# Patient Record
Sex: Male | Born: 1970 | Race: White | Hispanic: No | Marital: Married
Health system: Midwestern US, Academic
[De-identification: ages and names within clinical notes are randomized; demographics above are authoritative.]

---

## 2016-07-28 ENCOUNTER — Encounter: Admit: 2016-07-28 | Discharge: 2016-07-29 | Primary: Family

## 2016-07-28 ENCOUNTER — Encounter: Admit: 2016-07-28 | Discharge: 2016-07-28 | Primary: Family

## 2016-07-28 DIAGNOSIS — R69 Illness, unspecified: Principal | ICD-10-CM

## 2016-07-31 ENCOUNTER — Encounter: Admit: 2016-07-31 | Discharge: 2016-07-31 | Primary: Family

## 2016-07-31 DIAGNOSIS — S32010A Wedge compression fracture of first lumbar vertebra, initial encounter for closed fracture: Principal | ICD-10-CM

## 2016-08-01 ENCOUNTER — Ambulatory Visit: Admit: 2016-08-01 | Discharge: 2016-08-01 | Payer: BC Managed Care – PPO | Primary: Family

## 2016-08-01 ENCOUNTER — Encounter: Admit: 2016-08-01 | Discharge: 2016-08-01 | Primary: Family

## 2016-08-01 ENCOUNTER — Ambulatory Visit: Admit: 2016-08-01 | Discharge: 2016-08-01 | Primary: Family

## 2016-08-01 DIAGNOSIS — S32010A Wedge compression fracture of first lumbar vertebra, initial encounter for closed fracture: Principal | ICD-10-CM

## 2016-08-01 DIAGNOSIS — R69 Illness, unspecified: Principal | ICD-10-CM

## 2016-08-01 MED ORDER — OXYCODONE-ACETAMINOPHEN 5-325 MG PO TAB
1 | ORAL_TABLET | ORAL | 0 refills | 2.00000 days | Status: AC | PRN
Start: 2016-08-01 — End: 2016-12-19

## 2016-08-23 ENCOUNTER — Encounter: Admit: 2016-08-23 | Discharge: 2016-08-23 | Primary: Family

## 2016-08-23 DIAGNOSIS — S32010D Wedge compression fracture of first lumbar vertebra, subsequent encounter for fracture with routine healing: Principal | ICD-10-CM

## 2016-08-29 ENCOUNTER — Ambulatory Visit: Admit: 2016-08-29 | Discharge: 2016-08-29 | Payer: BC Managed Care – PPO | Primary: Family

## 2016-08-29 ENCOUNTER — Ambulatory Visit: Admit: 2016-08-29 | Discharge: 2016-08-29 | Primary: Family

## 2016-08-29 ENCOUNTER — Encounter: Admit: 2016-08-29 | Discharge: 2016-08-29 | Primary: Family

## 2016-08-29 DIAGNOSIS — S32010D Wedge compression fracture of first lumbar vertebra, subsequent encounter for fracture with routine healing: Principal | ICD-10-CM

## 2016-10-23 ENCOUNTER — Encounter: Admit: 2016-10-23 | Discharge: 2016-10-23 | Primary: Family

## 2016-10-23 DIAGNOSIS — S32010D Wedge compression fracture of first lumbar vertebra, subsequent encounter for fracture with routine healing: Principal | ICD-10-CM

## 2016-10-28 ENCOUNTER — Ambulatory Visit: Admit: 2016-10-28 | Discharge: 2016-10-28 | Payer: BC Managed Care – PPO | Primary: Family

## 2016-10-28 ENCOUNTER — Encounter: Admit: 2016-10-28 | Discharge: 2016-10-28 | Primary: Family

## 2016-10-28 ENCOUNTER — Ambulatory Visit: Admit: 2016-10-28 | Discharge: 2016-10-28 | Primary: Family

## 2016-10-28 DIAGNOSIS — S32010D Wedge compression fracture of first lumbar vertebra, subsequent encounter for fracture with routine healing: Principal | ICD-10-CM

## 2016-12-12 ENCOUNTER — Encounter: Admit: 2016-12-12 | Discharge: 2016-12-12 | Primary: Family

## 2016-12-12 DIAGNOSIS — S32010D Wedge compression fracture of first lumbar vertebra, subsequent encounter for fracture with routine healing: Principal | ICD-10-CM

## 2016-12-19 ENCOUNTER — Ambulatory Visit: Admit: 2016-12-19 | Discharge: 2016-12-19 | Payer: BC Managed Care – PPO | Primary: Family

## 2016-12-19 ENCOUNTER — Ambulatory Visit: Admit: 2016-12-19 | Discharge: 2016-12-19 | Primary: Family

## 2016-12-19 ENCOUNTER — Encounter: Admit: 2016-12-19 | Discharge: 2016-12-19 | Primary: Family

## 2016-12-19 DIAGNOSIS — S32010D Wedge compression fracture of first lumbar vertebra, subsequent encounter for fracture with routine healing: Principal | ICD-10-CM

## 2016-12-19 NOTE — Progress Notes
[facility]IC NOTE  Subjective     HISTORY:  He presents today in follow up of his L1 compression fracture.  He is about five months out from his injury.  He did attend about eight weeks of therapy, three times a week and is no longer attending therapy and completed therapy last Thursday.  Overall he is doing well and felt therapy was helpful.    PHYSICAL EXAMINATION:  No specific back discomfort.  Lower extremity reflexes, motor and sensory testing are stable              X-RAYS: X-rays obtained today of the lumbar spine, standing AP lateral, shows stable alignment.  His fracture appears to be healed.      IMPRESSION:  L1 compression fracture - healed.    PLAN: His x-rays were reviewed with him and his wife.  He was encouraged to transition to a home exercise program as instructed in physical therapy.  He was provided a note for his employer that he may return back to work without restrictions.  He will follow up with any changes, problems, concerns or questions.           Review of Systems   Constitutional: Negative.    HENT: Negative.    Eyes: Negative.    Respiratory: Negative.    Cardiovascular: Negative.    Gastrointestinal: Negative.    Endocrine: Negative.    Genitourinary: Negative.    Musculoskeletal: Negative.    Skin: Negative.    Allergic/Immunologic: Negative.    Neurological: Negative.    Hematological: Negative.    Psychiatric/Behavioral: Negative.        Current Outpatient Prescriptions:   ???  acetaminophen (TYLENOL) 500 mg tablet, Take 500 mg by mouth every 6 hours as needed for Pain. Max of 4,000 mg of acetaminophen in 24 hours., Disp: , Rfl:   ???  IBUPROFEN PO, Take  by mouth daily., Disp: , Rfl:   ???  naproxen sodium (ALEVE PO), Take  by mouth daily., Disp: , Rfl:   ???  oxyCODONE/acetaminophen (PERCOCET; ENDOCET; ROXICET) 5/325 mg tablet, Take 1 tablet by mouth every 6 hours as needed for Pain, Disp: 100 tablet, Rfl: 0  No Known Allergies  Physical Exam  Vitals:    12/19/16 1327 BP: 106/74   Pulse: 79   SpO2: 98%   Weight: 79.4 kg (175 lb)   Height: 176 cm (69.29)     Oswestry Total Score:: 0     Body mass index is 25.63 kg/m???Marland Kitchen             In the presence of Dr. Jean Rosenthal, I am taking down these notes, D. Lynch, scribe.  12/19/16 @ 2:00 pm

## 2018-11-10 ENCOUNTER — Encounter: Admit: 2018-11-10 | Discharge: 2018-11-10

## 2018-11-10 ENCOUNTER — Observation Stay: Admit: 2018-11-10 | Discharge: 2018-11-10 | Payer: BC Managed Care – PPO

## 2018-11-10 ENCOUNTER — Emergency Department: Admit: 2018-11-10 | Discharge: 2018-11-10

## 2018-11-10 MED ORDER — FENTANYL CITRATE (PF) 50 MCG/ML IJ SOLN
50 ug | Freq: Once | INTRAVENOUS | 0 refills | Status: CP
Start: 2018-11-10 — End: ?

## 2018-11-10 NOTE — ED Notes
Rechecked pedal pulse present

## 2018-11-10 NOTE — ED Notes
Unable to feel pedal pulse, unable to  Hear it with a doppler,

## 2018-11-11 LAB — VITAMIN B12: Lab: 468 pg/mL — ABNORMAL HIGH (ref 180–914)

## 2018-11-11 LAB — CELL COUNT W/DIFF-SYN FLUID

## 2018-11-11 LAB — CBC AND DIFF
Lab: 0.1 10*3/uL (ref 0–0.45)
Lab: 19 (ref <20.7)
Lab: 6 10*3/uL (ref 4.5–11.0)

## 2018-11-11 LAB — COMPREHENSIVE METABOLIC PANEL
Lab: 0.8 mg/dL — ABNORMAL LOW (ref 0.3–1.2)
Lab: 139 MMOL/L — ABNORMAL LOW (ref 137–147)
Lab: 22 U/L (ref 7–56)
Lab: 24 U/L (ref 7–40)
Lab: 25 MMOL/L (ref 21–30)
Lab: 4.4 g/dL (ref 3.5–5.0)
Lab: 87 U/L (ref 25–110)
Lab: 9.8 mg/dL (ref 8.5–10.6)
Lab: >60 mL/min (ref >60)
Lab: 137 MMOL/L — ABNORMAL LOW (ref >60)
Lab: 19 U/L (ref 7–56)
Lab: 21 U/L (ref 7–40)
Lab: 24 MMOL/L (ref 21–30)
Lab: 8 (ref 3–12)
Lab: 80 U/L (ref 25–110)
Lab: >60 mL/min (ref >60)
Lab: >60 mL/min (ref >60)

## 2018-11-11 LAB — URINALYSIS, MICROSCOPIC

## 2018-11-11 LAB — 25-OH VITAMIN D (D2 + D3): Lab: 32 ng/mL — ABNORMAL HIGH (ref 30–80)

## 2018-11-11 LAB — URINALYSIS DIPSTICK
Lab: NEGATIVE
Lab: NEGATIVE
Lab: NEGATIVE
Lab: NEGATIVE
Lab: NEGATIVE
Lab: NEGATIVE
Lab: NEGATIVE

## 2018-11-11 LAB — GRAM STAIN

## 2018-11-11 LAB — HIV 1& 2 AG-AB SCRN W REFLEX HIV 1 PCR QUANT

## 2018-11-11 LAB — SYPHILIS AB SCREEN: Lab: NEGATIVE mg/dL — ABNORMAL HIGH (ref 8.5–10.6)

## 2018-11-11 LAB — URIC ACID: Lab: 4.4 mg/dL (ref 4.0–8.0)

## 2018-11-11 LAB — CCP IGG ANTIBODY

## 2018-11-11 LAB — C REACTIVE PROTEIN (CRP): Lab: 1.7 mg/dL — ABNORMAL HIGH (ref <1.0)

## 2018-11-11 LAB — MAGNESIUM: Lab: 2 mg/dL (ref 1.6–2.6)

## 2018-11-11 LAB — COVID-19 (SARS-COV-2) PCR

## 2018-11-11 LAB — CRYSTAL ANALYSIS-SYNOVIAL FLUID

## 2018-11-11 LAB — CBC: Lab: 6.4 10*3/uL (ref >60)

## 2018-11-11 LAB — ANTI-DNA DOUBLE STRAND: Lab: <10 {titer} (ref <10)

## 2018-11-11 LAB — PROTIME INR (PT): Lab: 1 mg/dL — ABNORMAL HIGH (ref 0.8–1.2)

## 2018-11-11 LAB — PTT (APTT): Lab: 30 s — ABNORMAL HIGH (ref 24.0–36.5)

## 2018-11-11 LAB — RHEUMATOID FACTOR (RF): Lab: <10 [IU]/mL — ABNORMAL LOW (ref <25)

## 2018-11-11 LAB — SED RATE: Lab: 26 mm/h — ABNORMAL HIGH (ref 0–15)

## 2018-11-11 LAB — ANTI-NUCLEAR ANTIBODY(ANA): Lab: <80 {titer} — ABNORMAL LOW (ref <80)

## 2018-11-11 LAB — FOLATE, SERUM: Lab: 13 ng/mL (ref >3.9)

## 2018-11-11 MED ORDER — NICOTINE 21 MG/24 HR TD PT24
1 | Freq: Every day | TRANSDERMAL | 0 refills | Status: DC
Start: 2018-11-11 — End: 2018-11-13
  Administered 2018-11-11 – 2018-11-13 (×3): 1 via TRANSDERMAL

## 2018-11-11 MED ORDER — HYDROCODONE-ACETAMINOPHEN 5-325 MG PO TAB
1 | ORAL | 0 refills | Status: DC | PRN
Start: 2018-11-11 — End: 2018-11-11
  Administered 2018-11-11: 07:00:00 1 via ORAL

## 2018-11-11 MED ORDER — ENOXAPARIN 40 MG/0.4 ML SC SYRG
40 mg | Freq: Every day | SUBCUTANEOUS | 0 refills | Status: DC
Start: 2018-11-11 — End: 2018-11-13
  Administered 2018-11-12 – 2018-11-13 (×2): 40 mg via SUBCUTANEOUS

## 2018-11-11 MED ORDER — IMS MIXTURE TEMPLATE
800 mg | Freq: Three times a day (TID) | ORAL | 0 refills | Status: DC
Start: 2018-11-11 — End: 2018-11-11
  Administered 2018-11-11 (×2): 800 mg via ORAL

## 2018-11-11 MED ORDER — KETOROLAC 15 MG/ML IJ SOLN
15 mg | INTRAVENOUS | 0 refills | Status: DC | PRN
Start: 2018-11-11 — End: 2018-11-13
  Administered 2018-11-12 – 2018-11-13 (×3): 15 mg via INTRAVENOUS

## 2018-11-11 MED ORDER — OXYCODONE-ACETAMINOPHEN 5-325 MG PO TAB
1 | ORAL | 0 refills | Status: DC | PRN
Start: 2018-11-11 — End: 2018-11-13
  Administered 2018-11-11: 11:00:00 1 via ORAL

## 2018-11-11 MED ADMIN — FENTANYL CITRATE (PF) 50 MCG/ML IJ SOLN [3037]: 50 ug | INTRAVENOUS | @ 04:00:00 | Stop: 2018-11-11 | NDC 00409909412

## 2018-11-11 NOTE — Consults
[facility] Note    During normal business hours (Monday - Friday 7AM - 5 PM), please contact Rudell Cobb, Sabino Donovan, Velda Shell. At all other times, contact the orthopedic surgery resident on call for any questions or concerns.      Admission Date: 11/10/2018                                                  Chief Complaint/Reason for Consult: Right ankle pain    Assessment/Plan   Philip Bennett is a 48 y.o. male without significant past medical history presents with elevated inflammatory markers, right ankle pain and swelling, concern for septic arthritis    Operative Intervention: No acute orthopedic surgical intervention at this time, pending results of right ankle aspiration  WB status: WBAT RLE   Antibiotics/tetanus: Per primary team, ankle aspirate has been obtained  Pain Control: Per primary team  Diet: Please keep n.p.o. after midnight  DVT Ppx: Per primary team  ?Ultrasound for DVT was negative.  I will obtain x-rays of the right tibia/fibula for completeness sake due to anterior fullness and edema    Intervention/Procedure: After risk, benefits, alternatives was discussed with the patient and his wife, they were agreeable to proceed with right ankle aspiration in order to obtain results of the right ankle joint.  An 18-gauge needle was inserted into the right ankle joint just medial to the anterior tibialis tendon.  Sterile prep was used with Betadine, alcohol.  Approximately 5 mL of cloudy synovial fluid mixed with sanguinous fluid was obtained.  This was sent for cell count, culture, Gram stain, crystals.      Disposition: This patient is being admitted to the internal medicine service due to the inability to bear weight and pain as well as concern for septic arthritis.    This pt will be discussed with Dr. Desiree Hane, who will direct the plan of care.     Orvan Falconer [id number]      --------------------------------------------------------------------------------------------------- History of Present Illness: Philip Bennett is a 48 y.o. male.  This patient has a past medical history of tobacco use, half pack per day, 2-3 beers per day, no significant medical history and presents with a 2 to 3-day history of worsening right ankle pain and swelling.  He does endorse being awoken in the night 3 nights ago with severe ankle pain.  He has subsequently had worsening of this and inability to bear weight on the right ankle.  He also endorses a right lower extremity calf and shin pain as well.  Though, most notably the pain in the right ankle.  He states that he also had a intermittent numbness and tingling in bilateral upper extremities that resolved 3 nights ago.  Denies any constitutional symptoms such as fevers, chills, chest pain, shortness of breath, diarrhea, dysuria, abdominal pain.  No coronavirus contacts.      10 point review of systems reviewed with the patient.   Pertinent positives: bone pain, right ankle pain, right lower extremity anterior calf and shin pain.  Pertinent negatives: Patient denies chest pain, shortness of breath, abdominal pain, dysuria, diarrhea, fever, chills, other constitutional symptoms at this time.      Physical Exam:    Constitutional: No acute distress  Neurologic: Alert  Eyes: EOM grossly intact  HEENT: Oropharynx/Nasopharynx clear  Respiratory: Unlabored respirations  Cardiovascular:  Rate normal  Gastrointestinal: No obvious distention  MSK:     Right Lower Extremity Exam:  SILT in the tibial/peroneal nerve distribution  Plantarflexion/dorsiflexion of the ankle and toes intact  Pulse: Palpable DP and PT pulse  Other exam findings: Right ankle effusion present.  There is no erythema or induration.  He does have a fullness in the right anterior shin.    Bilateral upper extremities were examined as well he has sensation intact light touch in the median/ulnar/radial nerve distribution.  Palpable radial and ulnar pulse bilaterally. Pertinent Lab/Radiology/Other Diagnostic Tests:    Radiology: X-ray right ankle reveals no acute osseous abnormalities.  There is no acute fracture dislocation noted.  Ultrasound noted a subcutaneous anemia in the anteromedial t lower leg.    Laboratory: Elevated ESR and CRP.  ESR is 26 CRP is 1.7.      No past medical history on file.  Surgical History:   Procedure Laterality Date   ? FOOT SURGERY Right    ? HAND SURGERY Bilateral        Social History  Social History     Socioeconomic History   ? Marital status: Married     Spouse name: Not on file   ? Number of children: Not on file   ? Years of education: Not on file   ? Highest education level: Not on file   Occupational History   ? Not on file   Tobacco Use   ? Smoking status: Current Every Day Smoker     Packs/day: 1.50   ? Smokeless tobacco: Never Used   Substance and Sexual Activity   ? Alcohol use: Yes     Comment: occasionally    ? Drug use: No   ? Sexual activity: Not on file   Other Topics Concern   ? Not on file   Social History Narrative   ? Not on file           Family history  Family History   Problem Relation Age of Onset   ? Hypertension Mother    ? Cancer Father          Allergies:  Patient has no known allergies.    Outpatient Medications as of 11/10/2018   Medication Sig Dispense Refill   ? acetaminophen (TYLENOL) 500 mg tablet Take 500 mg by mouth every 6 hours as needed for Pain. Max of 4,000 mg of acetaminophen in 24 hours.     ? IBUPROFEN PO Take  by mouth daily.     ? naproxen sodium (ALEVE PO) Take  by mouth daily.           Review of Systems:  A 12 point review of systems is negative other than those noted in the HPI          Vital Signs:  Last Filed in 24 hours   BP: 134/101 (10/06 1726)  Temp: 37.1 ?C (98.8 ?F) (10/06 1726)  Respirations: 20 PER MINUTE (10/06 1726)  SpO2: 100 % (10/06 1726)  SpO2 Pulse: 88 (10/06 1726)       CBC w/Diff   Lab Results   Component Value Date/Time    WBC 6.0 11/10/2018 07:38 PM HGB 14.0 11/10/2018 07:38 PM    HCT 40.7 11/10/2018 07:38 PM    PLTCT 304 11/10/2018 07:38 PM           Basic Metabolic Profile   Lab Results   Component Value Date/Time    NA 139 11/10/2018 07:38 PM  K 4.3 11/10/2018 07:38 PM    CL 104 11/10/2018 07:38 PM    CO2 25 11/10/2018 07:38 PM    GAP 10 11/10/2018 07:38 PM    BUN 12 11/10/2018 07:38 PM    CR 1.07 11/10/2018 07:38 PM    GLU 94 11/10/2018 07:38 PM        Coagulation Studies   Lab Results   Component Value Date/Time    PTT 30.7 11/10/2018 07:38 PM    INR 1.0 11/10/2018 07:38 PM        Vital Signs:  Last Filed in 24 hours   BP: 134/101 (10/06 1726)  Temp: 37.1 ?C (98.8 ?F) (10/06 1726)  Respirations: 20 PER MINUTE (10/06 1726)  SpO2: 100 % (10/06 1726)  SpO2 Pulse: 88 (10/06 1726)       CBC w/Diff   Lab Results   Component Value Date/Time    WBC 6.0 11/10/2018 07:38 PM    HGB 14.0 11/10/2018 07:38 PM    HCT 40.7 11/10/2018 07:38 PM    PLTCT 304 11/10/2018 07:38 PM           Basic Metabolic Profile   Lab Results   Component Value Date/Time    NA 139 11/10/2018 07:38 PM    K 4.3 11/10/2018 07:38 PM    CL 104 11/10/2018 07:38 PM    CO2 25 11/10/2018 07:38 PM    GAP 10 11/10/2018 07:38 PM    BUN 12 11/10/2018 07:38 PM    CR 1.07 11/10/2018 07:38 PM    GLU 94 11/10/2018 07:38 PM        Coagulation Studies   Lab Results   Component Value Date/Time    PTT 30.7 11/10/2018 07:38 PM    INR 1.0 11/10/2018 07:38 PM           R. Orvan Falconer, MD  Pager [phone]

## 2018-11-12 LAB — BASIC METABOLIC PANEL
Lab: 1.1 mg/dL (ref 0.4–1.24)
Lab: 105 MMOL/L — ABNORMAL LOW (ref 98–110)
Lab: 122 mg/dL — ABNORMAL HIGH (ref 70–100)
Lab: 138 MMOL/L — ABNORMAL LOW (ref 137–147)
Lab: 17 mg/dL (ref 7–25)
Lab: 24 MMOL/L — ABNORMAL HIGH (ref 21–30)
Lab: 4.6 MMOL/L (ref 3.5–5.1)
Lab: 9 pg — ABNORMAL HIGH (ref 3–12)
Lab: 9.4 mg/dL — ABNORMAL LOW (ref 8.5–10.6)
Lab: >60 mL/min (ref >60)
Lab: >60 mL/min (ref >60)

## 2018-11-12 LAB — GRAM STAIN

## 2018-11-12 LAB — HLA-B27: Lab: POSITIVE mg/dL — ABNORMAL LOW (ref 0.3–1.2)

## 2018-11-12 LAB — CBC: Lab: 6.3 10*3/uL (ref 4.5–11.0)

## 2018-11-12 MED ORDER — IMS MIXTURE TEMPLATE
15 mg | Freq: Every day | ORAL | 0 refills | Status: DC
Start: 2018-11-12 — End: 2018-11-13
  Administered 2018-11-12 – 2018-11-13 (×4): 15 mg via ORAL

## 2018-11-12 MED ORDER — CHOLECALCIFEROL (VITAMIN D3) 10 MCG (400 UNIT) PO TAB
400 [IU] | Freq: Every day | ORAL | 0 refills | Status: DC
Start: 2018-11-12 — End: 2018-11-13
  Administered 2018-11-12 – 2018-11-13 (×2): 400 [IU] via ORAL

## 2018-11-13 ENCOUNTER — Encounter: Admit: 2018-11-13 | Discharge: 2018-11-13 | Payer: BC Managed Care – PPO

## 2018-11-13 LAB — CRYSTAL ANALYSIS-SYNOVIAL FLUID

## 2018-11-13 LAB — CBC
Lab: 103 FL — ABNORMAL HIGH (ref 80–100)
Lab: 12 % (ref 11–15)
Lab: 14 g/dL (ref 13.5–16.5)
Lab: 292 10*3/uL (ref 150–400)
Lab: 3.8 M/UL — ABNORMAL LOW (ref 4.4–5.5)
Lab: 34 g/dL (ref 32.0–36.0)
Lab: 36 pg — ABNORMAL HIGH (ref 26–34)
Lab: 40 % (ref 40–50)
Lab: 6.8 FL — ABNORMAL LOW (ref 7–11)
Lab: 7.5 K/UL (ref >60)

## 2018-11-13 LAB — BASIC METABOLIC PANEL: Lab: 141 MMOL/L — ABNORMAL LOW (ref >60)

## 2018-11-13 LAB — CELL COUNT W/DIFF-SYN FLUID

## 2018-11-13 MED ORDER — SULFASALAZINE 500 MG PO TBEC
500 mg | ORAL_TABLET | Freq: Two times a day (BID) | ORAL | 1 refills | Status: AC
Start: 2018-11-13 — End: ?
  Filled 2018-11-13: qty 180, 30d supply, fill #1

## 2018-11-13 MED ORDER — NICOTINE 21 MG/24 HR TD PT24
1 | MEDICATED_PATCH | Freq: Every day | TRANSDERMAL | 0 refills | Status: DC
Start: 2018-11-13 — End: 2019-07-29
  Filled 2018-11-13: qty 28, 28d supply, fill #1

## 2018-11-13 MED ORDER — NAPROXEN 500 MG PO TAB
500 mg | ORAL_TABLET | Freq: Two times a day (BID) | ORAL | 0 refills | Status: DC
Start: 2018-11-13 — End: 2018-12-23
  Filled 2018-11-13: qty 28, 14d supply, fill #1

## 2018-11-13 MED ORDER — IBUPROFEN 200 MG PO TAB
600-800 mg | ORAL_TABLET | ORAL | 0 refills | Status: CN | PRN
Start: 2018-11-13 — End: ?

## 2018-11-13 MED ORDER — PREDNISONE 5 MG PO TAB
ORAL_TABLET | Freq: Every day | ORAL | 0 refills | Status: AC
Start: 2018-11-13 — End: ?
  Filled 2018-11-13: qty 84, 28d supply, fill #1

## 2018-11-13 MED ORDER — SULFASALAZINE 500 MG PO TBEC
500 mg | Freq: Two times a day (BID) | ORAL | 0 refills | Status: DC
Start: 2018-11-13 — End: 2018-11-13

## 2018-11-13 MED ORDER — CHOLECALCIFEROL (VITAMIN D3) 10 MCG (400 UNIT) PO TAB
400 [IU] | Freq: Every day | ORAL | 0 refills | 84.00000 days | Status: AC
Start: 2018-11-13 — End: ?

## 2018-11-14 LAB — CHLAM/NG PCR SWAB
Lab: NEGATIVE
Lab: NEGATIVE

## 2018-11-15 LAB — CULTURE-WOUND/TISSUE/FLUID(AEROBIC ONLY)W/SENSITIVITY: Lab: <2 %

## 2018-11-16 LAB — HLA-B27: Lab: POSITIVE

## 2018-11-17 LAB — CULTURE-BLOOD W/SENSITIVITY

## 2018-11-18 LAB — CULTURE-WOUND/TISSUE/FLUID(AEROBIC ONLY)W/SENSITIVITY

## 2018-11-25 ENCOUNTER — Encounter: Admit: 2018-11-25 | Discharge: 2018-11-25 | Payer: BC Managed Care – PPO

## 2018-11-25 NOTE — Telephone Encounter
Received paperwork from [place]. Paperwork completed by Dr. Waynard Edwards. Faxing paperwork to [place] at [phone]. Placing a copy in Onbase.

## 2018-12-07 ENCOUNTER — Encounter: Admit: 2018-12-07 | Discharge: 2018-12-07 | Payer: BC Managed Care – PPO

## 2018-12-12 ENCOUNTER — Encounter: Admit: 2018-12-12 | Discharge: 2018-12-12 | Payer: BC Managed Care – PPO

## 2018-12-23 ENCOUNTER — Encounter: Admit: 2018-12-23 | Discharge: 2018-12-23 | Payer: BC Managed Care – PPO

## 2018-12-23 ENCOUNTER — Ambulatory Visit: Admit: 2018-12-23 | Discharge: 2018-12-23 | Payer: BC Managed Care – PPO

## 2018-12-23 DIAGNOSIS — M25561 Pain in right knee: Secondary | ICD-10-CM

## 2018-12-23 DIAGNOSIS — M0230 Reiter's disease, unspecified site: Secondary | ICD-10-CM

## 2018-12-23 DIAGNOSIS — D849 Immunodeficiency, unspecified: Secondary | ICD-10-CM

## 2018-12-23 MED ORDER — PREDNISONE 5 MG PO TAB
2.5 mg | ORAL_TABLET | Freq: Every day | ORAL | 0 refills | Status: DC
Start: 2018-12-23 — End: 2019-05-24

## 2018-12-23 MED ORDER — NAPROXEN 500 MG PO TAB
500 mg | ORAL_TABLET | Freq: Two times a day (BID) | ORAL | 3 refills | Status: DC
Start: 2018-12-23 — End: 2019-07-29

## 2018-12-24 LAB — HEPATITIS C ANTIBODY W REFLEX HCV PCR QUANT

## 2018-12-24 LAB — HEPATITIS B SURFACE AB: Lab: NEGATIVE % (ref 4–12)

## 2018-12-24 LAB — CBC AND DIFF
Lab: 0 10*3/uL (ref 0–0.20)
Lab: 0.4 10*3/uL (ref 0–0.80)
Lab: 1 % (ref 0–5)
Lab: 15 % — ABNORMAL HIGH (ref 11–15)
Lab: 18 % — ABNORMAL LOW (ref 24–44)
Lab: 3.8 M/UL — ABNORMAL LOW (ref 4.4–5.5)
Lab: 36 pg — ABNORMAL HIGH (ref 26–34)
Lab: 4.7 10*3/uL (ref 1.8–7.0)
Lab: 6.5 10*3/uL (ref 4.5–11.0)
Lab: 7.1 FL (ref 7–11)

## 2018-12-24 LAB — CREATININE
Lab: 1.2 mg/dL (ref 0.4–1.24)
Lab: >60 mL/min (ref >60)

## 2018-12-24 LAB — HEPATITIS B SURFACE AG

## 2018-12-24 LAB — HEPATITIS A TOTAL AB (IGG+IGM)

## 2018-12-24 LAB — AST (SGOT): Lab: 26 U/L — ABNORMAL HIGH (ref >60)

## 2018-12-24 LAB — SED RATE: Lab: 1 mm/h (ref 0–15)

## 2018-12-24 LAB — C REACTIVE PROTEIN (CRP): Lab: 0.1 mg/dL (ref <1.0)

## 2018-12-24 LAB — HEPATITIS B CORE AB TOT (IGG+IGM)

## 2018-12-29 ENCOUNTER — Encounter: Admit: 2018-12-29 | Discharge: 2018-12-29 | Payer: BC Managed Care – PPO

## 2018-12-29 NOTE — Telephone Encounter
-----   Message from Craige Cotta, MD sent at 12/23/2018  4:55 PM CST -----  Can you send the last clinic note and autoimmune labs and imaging from his hospitalization in October 2020 to his PCP, Ms. Aundra Dubin in [place]. Phone number is [phone]. Can you also fax labs (CBC and diff, Cr, AST) to [facility] in [facility]. Their phone number is [phone]. Thanks!Craige Cotta, MDRheumatology Fellow, PGY-4Pager Number: [phone]

## 2018-12-30 NOTE — Telephone Encounter
Faxing labs orders to [facility] lab at [phone].     Unable to get fax number to fax medical records to Dr. Aundra Dubin, Will call tomorrow.

## 2019-05-10 ENCOUNTER — Encounter: Admit: 2019-05-10 | Discharge: 2019-05-10 | Payer: BC Managed Care – PPO

## 2019-05-10 NOTE — Telephone Encounter
Records received from Dr Sandria Senter office. Documents [place] on base for scanning.    Staff message sent to Dr Renee Rival for review

## 2019-05-19 ENCOUNTER — Encounter: Admit: 2019-05-19 | Discharge: 2019-05-19 | Payer: BC Managed Care – PPO

## 2019-05-19 ENCOUNTER — Ambulatory Visit: Admit: 2019-05-19 | Discharge: 2019-05-19 | Payer: BC Managed Care – PPO

## 2019-05-19 DIAGNOSIS — R21 Rash and other nonspecific skin eruption: Secondary | ICD-10-CM

## 2019-05-19 DIAGNOSIS — R112 Nausea with vomiting, unspecified: Secondary | ICD-10-CM

## 2019-05-19 DIAGNOSIS — D849 Immunodeficiency, unspecified: Secondary | ICD-10-CM

## 2019-05-19 DIAGNOSIS — M0230 Reiter's disease, unspecified site: Secondary | ICD-10-CM

## 2019-05-19 LAB — CBC AND DIFF
Lab: 0 10*3/uL (ref 0–0.45)
Lab: 0.4 10*3/uL (ref 0–0.80)
Lab: 0.9 10*3/uL — ABNORMAL LOW (ref 1.0–4.8)
Lab: 1 % (ref 0–2)
Lab: 1 % (ref 0–5)
Lab: 10 % (ref 4–12)
Lab: 110 FL — ABNORMAL HIGH (ref 80–100)
Lab: 13 g/dL — ABNORMAL LOW (ref 13.5–16.5)
Lab: 15 % — ABNORMAL HIGH (ref 11–15)
Lab: 2.8 10*3/uL (ref 1.8–7.0)
Lab: 21 % — ABNORMAL LOW (ref <20.7)
Lab: 246 K/UL — ABNORMAL HIGH (ref 150–400)
Lab: 3.6 M/UL — ABNORMAL LOW (ref 4.4–5.5)
Lab: 33 g/dL — ABNORMAL HIGH (ref >60)
Lab: 37 pg — ABNORMAL HIGH (ref 26–34)
Lab: 4.3 K/UL — ABNORMAL LOW (ref 4.5–11.0)
Lab: 40 % — AB (ref 40–50)
Lab: 6.8 FL — ABNORMAL LOW (ref 7–11)
Lab: 67 % (ref 41–77)

## 2019-05-19 LAB — HIV 1& 2 AG-AB SCRN W REFLEX HIV 1 PCR QUANT

## 2019-05-19 LAB — FOLATE, SERUM: Lab: 11 ng/mL (ref >3.9)

## 2019-05-19 LAB — COMPREHENSIVE METABOLIC PANEL
Lab: 0.7 mg/dL (ref 0.3–1.2)
Lab: 1.2 mg/dL (ref 0.4–1.24)
Lab: 102 mg/dL — ABNORMAL HIGH (ref 70–100)
Lab: 104 MMOL/L (ref 98–110)
Lab: 142 MMOL/L (ref 137–147)
Lab: 4.4 g/dL (ref 3.5–5.0)
Lab: 4.5 MMOL/L (ref 3.5–5.1)
Lab: 7 g/dL (ref 6.0–8.0)
Lab: 9 mg/dL (ref 7–25)
Lab: 9.8 mg/dL (ref 8.5–10.6)

## 2019-05-19 LAB — SED RATE: Lab: <1 mm/h — ABNORMAL LOW (ref >60)

## 2019-05-19 LAB — C REACTIVE PROTEIN (CRP): Lab: 0.1 mg/dL (ref <1.0)

## 2019-05-19 LAB — VITAMIN B12: Lab: 531 pg/mL (ref 180–914)

## 2019-05-19 MED ORDER — GABAPENTIN 100 MG PO CAP
100 mg | ORAL_CAPSULE | Freq: Every evening | ORAL | 3 refills | Status: AC
Start: 2019-05-19 — End: ?

## 2019-05-19 MED ORDER — NYSTATIN 100,000 UNIT/ML PO SUSP
500000 [IU] | Freq: Four times a day (QID) | ORAL | 0 refills | 12.00000 days | Status: AC
Start: 2019-05-19 — End: ?

## 2019-05-24 MED ORDER — ADALIMUMAB 40 MG/0.4 ML SC PNKT
40 mg | SUBCUTANEOUS | 4 refills | Status: DC
Start: 2019-05-24 — End: 2019-05-28

## 2019-05-24 MED ORDER — PREDNISONE 5 MG PO TAB
ORAL_TABLET | Freq: Every day | ORAL | 0 refills | Status: AC
Start: 2019-05-24 — End: ?

## 2019-05-24 MED ORDER — ADALIMUMAB 40 MG/0.8 ML SC PNKT
40 mg | SUBCUTANEOUS | 3 refills | Status: DC
Start: 2019-05-24 — End: 2019-05-24

## 2019-05-24 NOTE — Telephone Encounter
Patient called today stating he was returning a call from Dr. Renee Rival from this morning.   He stated he believes he was calling to review recent blood work results.   Patient can be reached at [phone].    Routing to provider.

## 2019-05-24 NOTE — Progress Notes
The Prior Authorization for Humira was submitted for Philip Bennett via Cover My Meds.  Will continue to follow.    Teagon Kron  Pharmacy Patient Advocate  [phone]

## 2019-05-25 MED ORDER — MUPIROCIN 2 % TP OINT
Freq: Every day | TOPICAL | 3 refills | 11.00000 days | Status: AC
Start: 2019-05-25 — End: ?

## 2019-05-25 MED ORDER — CLINDAMYCIN PHOSPHATE 1 % TP SOLN
Freq: Every day | 11 refills | 7.00000 days | Status: AC
Start: 2019-05-25 — End: ?

## 2019-05-25 MED ORDER — CHLORHEXIDINE GLUCONATE 4 % TP LIQD
Freq: Every day | 11 refills | 16.00000 days | Status: AC
Start: 2019-05-25 — End: ?

## 2019-05-25 NOTE — Progress Notes
ATTESTATION    I personally performed the key portions of the E/M visit, discussed case with resident and concur with resident documentation of history, physical exam, assessment, and treatment plan unless otherwise noted.    Staff name:  Dorothyann Peng, MD Date:  05/25/2019

## 2019-05-25 NOTE — Progress Notes
The Prior Authorization for Humira was approved for Philip Bennett from 05/24/19 to 05/23/20. The PA authorization number is [id number].    Reford E Casebier's prescription for Humira is not able to be filled by [facility] due to the patient's prescription insurance.  Pharmacy has contacted Betsy Coder to notify them that their prescription will be sent to [facility] as required by their prescription insurance.  The specialty pharmacy team has also notified the ambulatory clinical pharmacist by email.    Fredda Hammed  Pharmacy Patient Advocate  [phone]

## 2019-05-28 MED ORDER — ADALIMUMAB 40 MG/0.4 ML SC PNKT
40 mg | SUBCUTANEOUS | 4 refills | Status: DC
Start: 2019-05-28 — End: 2019-06-11

## 2019-05-31 MED ORDER — NAPROXEN 250 MG PO TAB
250 mg | ORAL_TABLET | Freq: Two times a day (BID) | ORAL | 1 refills | Status: DC
Start: 2019-05-31 — End: 2019-08-16

## 2019-05-31 MED ORDER — ONDANSETRON 4 MG PO TBDI
4 mg | ORAL_TABLET | ORAL | 1 refills | 8.00000 days | Status: DC | PRN
Start: 2019-05-31 — End: 2019-08-16

## 2019-06-11 ENCOUNTER — Encounter: Admit: 2019-06-11 | Discharge: 2019-06-11 | Payer: BC Managed Care – PPO

## 2019-06-11 MED ORDER — ADALIMUMAB 40 MG/0.4 ML SC PNKT
40 mg | SUBCUTANEOUS | 4 refills | Status: DC
Start: 2019-06-11 — End: 2019-08-03

## 2019-06-15 ENCOUNTER — Encounter: Admit: 2019-06-15 | Discharge: 2019-06-15 | Payer: BC Managed Care – PPO

## 2019-06-15 NOTE — Progress Notes
Called Philip Bennett to discuss medication assistance regarding their medication: Humira.  Left voicemail asking patient to return call to the specialty pharmacy.  Will call patient again in 2 business days if no call back received.Fredda Hammed, CPhT  Pharmacy Patient Advocate, Specialty Pharmacy  [phone]

## 2019-06-16 ENCOUNTER — Encounter: Admit: 2019-06-16 | Discharge: 2019-06-16 | Payer: BC Managed Care – PPO

## 2019-06-16 NOTE — Telephone Encounter
[facility] pharmacy called this morning requesting clarification on patients Humira prescription.   Nurse returned call to discuss.   Informed pharmacy that the script should be Humira 40 mg/0.4 mL injection- Inject 0.4 mL under the skin every 14 days.  Cofirmed diagnosis code- M02.30.   Discussed that patient is also currently taking Sulfasalazine, Prednisone, and Naproxen.   Pharmacist verbalized understanding and will call with any other questions or concerns.

## 2019-06-17 ENCOUNTER — Encounter: Admit: 2019-06-17 | Discharge: 2019-06-17 | Payer: BC Managed Care – PPO

## 2019-06-17 NOTE — Progress Notes
Kong E Prichard's prescription for Humira is not able to be filled by [facility] due to the patient's prescription insurance.  Pharmacy has contacted Betsy Coder to notify them that their prescription will be sent to [facility] as required by their prescription insurance.      Fredda Hammed  Pharmacy Patient Advocate  [phone]

## 2019-07-19 ENCOUNTER — Encounter: Admit: 2019-07-19 | Discharge: 2019-07-19 | Payer: BC Managed Care – PPO

## 2019-07-19 NOTE — Telephone Encounter
Pt called and left vm stating he needs someone to call him back because would like to speak to someone about an incident that happened last night. Pt also stating he needs paperwork filled out so that he does not have to go work.      This RN returned pt's call. Pt states he started with a swollen eye about a week ago that had green drainage and then went away. Shortly after he started having 3-4 days of pain in jaw that radiated to his ear. Pt states he went on a trip to [place] and as he was driving back, yesterday, he started having blurry vision, swelling in knees and feet, chest pain that was radiating from chest to his left arm, and numbness in left arm. Pt states he stopped at [facility] and is requesting that rheumatology clinic obtain labs that were drawn yesterday. Pt reports he is feeling well today and that he only has fatigue and weakness. Pt also requesting Dr. Renee Rival fill out a new FMLA/Leave of absence form, because pt states he can not go to work. Pt states after his wife sends his FMLA/Leave paperwork, it will need to be filled out and turned in that same day.     This RN reviewed pt's information with Dr. Renee Rival. Dr. Renee Rival asking if pt has redness, swelling or warmth to joints. Dr. Renee Rival also wanting to know if pt is currently taking humira as prescribed. Regarding FMLA/Leave of absence form, Dr. Renee Rival would like for pt to review this with PCP since he is clear from a rheumatologic stand point.     This RN called pt and asked pt if he had swelling in any of his joints, pt stated his ankles and feet. Pt denied having redness or warmth to joints. Pt states is currently taking humira as prescribed and had his first dose on 5/30 and will have his second dose today, 6/14. This RN also informed pt that Dr. Renee Rival would like for him to review FMLA/Leave of absence form with PCP. Pt states he will not see his new PCP until 6/24 and does not want to start paperwork all over with another provider. This RN informed pt he is clear to work from a rheumatologic stand point. Pt states he disagrees and does not feel like he can return to work. This RN also notified pt Dr. Renee Rival would prescribe prednisone if he felt joint swelling could improve. Pt upset about Dr. Renee Rival not filling out FMLA/Leave of absence form. This RN stated that Dr. Renee Rival would be notified.    Dr. Renee Rival in office and notified of above conversation with pt.
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
## 2019-07-29 ENCOUNTER — Ambulatory Visit
Admit: 2019-07-29 | Discharge: 2019-07-29 | Payer: BC Managed Care – PPO | Primary: Student in an Organized Health Care Education/Training Program

## 2019-07-29 ENCOUNTER — Encounter
Admit: 2019-07-29 | Discharge: 2019-07-29 | Payer: BC Managed Care – PPO | Primary: Student in an Organized Health Care Education/Training Program

## 2019-07-29 ENCOUNTER — Ambulatory Visit
Admit: 2019-07-29 | Discharge: 2019-07-29 | Payer: No Typology Code available for payment source | Primary: Student in an Organized Health Care Education/Training Program

## 2019-07-29 DIAGNOSIS — A4902 Methicillin resistant Staphylococcus aureus infection, unspecified site: Secondary | ICD-10-CM

## 2019-07-29 DIAGNOSIS — M1990 Unspecified osteoarthritis, unspecified site: Secondary | ICD-10-CM

## 2019-07-29 DIAGNOSIS — M0230 Reiter's disease, unspecified site: Secondary | ICD-10-CM

## 2019-07-29 DIAGNOSIS — Z0000 Encounter for general adult medical examination without abnormal findings: Secondary | ICD-10-CM

## 2019-07-29 DIAGNOSIS — M797 Fibromyalgia: Secondary | ICD-10-CM

## 2019-07-29 DIAGNOSIS — S069X9A Unspecified intracranial injury with loss of consciousness of unspecified duration, initial encounter: Secondary | ICD-10-CM

## 2019-07-29 DIAGNOSIS — F331 Major depressive disorder, recurrent, moderate: Secondary | ICD-10-CM

## 2019-07-29 LAB — LIPID PROFILE
Lab: 110 mg/dL — ABNORMAL HIGH (ref <100)
Lab: 127 mg/dL (ref 3.5–5.0)
Lab: 16 mg/dL (ref 0.3–1.2)
Lab: 195 mg/dL (ref <200)
Lab: 81 mg/dL (ref <150)

## 2019-07-29 LAB — COMPREHENSIVE METABOLIC PANEL
Lab: 140 MMOL/L (ref 137–147)
Lab: 17 U/L (ref 7–56)
Lab: 23 U/L (ref 7–40)
Lab: 30 MMOL/L (ref 21–30)
Lab: 4.6 MMOL/L (ref 3.5–5.1)
Lab: 74 U/L (ref 25–110)
Lab: 99 mg/dL (ref 70–100)
Lab: >60 mL/min (ref >60)
Lab: 6 (ref 3–12)

## 2019-07-29 LAB — TSH WITH FREE T4 REFLEX: Lab: 2.3 uU/mL (ref >40)

## 2019-07-29 LAB — HEMOGLOBIN A1C: Lab: 4.9 % (ref 4.0–6.0)

## 2019-07-29 MED ORDER — LIDOCAINE 5 % TP PTMD
1 | MEDICATED_PATCH | TOPICAL | 3 refills | 30.00000 days | Status: AC
Start: 2019-07-29 — End: ?

## 2019-07-29 MED ORDER — CITALOPRAM 40 MG PO TAB
40 mg | ORAL_TABLET | Freq: Every day | ORAL | 0 refills | Status: AC
Start: 2019-07-29 — End: ?

## 2019-07-31 ENCOUNTER — Encounter
Admit: 2019-07-31 | Discharge: 2019-07-31 | Payer: BC Managed Care – PPO | Primary: Student in an Organized Health Care Education/Training Program

## 2019-08-02 ENCOUNTER — Encounter
Admit: 2019-08-02 | Discharge: 2019-08-02 | Payer: BC Managed Care – PPO | Primary: Student in an Organized Health Care Education/Training Program

## 2019-08-02 MED ORDER — LIDOCAINE 5 % TP PTMD
1 | MEDICATED_PATCH | TOPICAL | 3 refills
Start: 2019-08-02 — End: ?

## 2019-08-03 ENCOUNTER — Encounter
Admit: 2019-08-03 | Discharge: 2019-08-03 | Payer: BC Managed Care – PPO | Primary: Student in an Organized Health Care Education/Training Program

## 2019-08-03 MED ORDER — HUMIRA(CF) PEN 40 MG/0.4 ML SC PNKT
PACK | 2 refills | Status: DC
Start: 2019-08-03 — End: 2019-08-03

## 2019-08-03 MED ORDER — HUMIRA(CF) PEN 40 MG/0.4 ML SC PNKT
40 mg | PACK | SUBCUTANEOUS | 2 refills | Status: DC
Start: 2019-08-03 — End: 2019-08-03

## 2019-08-03 MED ORDER — HUMIRA(CF) PEN 40 MG/0.4 ML SC PNKT
40 mg | PACK | SUBCUTANEOUS | 2 refills | Status: DC
Start: 2019-08-03 — End: 2019-08-10

## 2019-08-05 ENCOUNTER — Encounter
Admit: 2019-08-05 | Discharge: 2019-08-05 | Payer: BC Managed Care – PPO | Primary: Student in an Organized Health Care Education/Training Program

## 2019-08-05 NOTE — Progress Notes
The Prior Authorization for Humira was submitted for Philip Bennett via Cover My Meds.  Will continue to follow.    Fredda Hammed  Pharmacy Patient Advocate  [phone]

## 2019-08-06 ENCOUNTER — Encounter
Admit: 2019-08-06 | Discharge: 2019-08-06 | Payer: BC Managed Care – PPO | Primary: Student in an Organized Health Care Education/Training Program

## 2019-08-06 NOTE — Progress Notes
Humria denied under patient's new insurance plan. They require a baseline CDAI score, however, since patient is already on Humira, unable to provide a baseline score.    Called insurance to complete a peer to peer. Scheduled for 7/6 between 10am and 12pm.

## 2019-08-10 ENCOUNTER — Encounter
Admit: 2019-08-10 | Discharge: 2019-08-10 | Payer: BC Managed Care – PPO | Primary: Student in an Organized Health Care Education/Training Program

## 2019-08-10 MED ORDER — ONDANSETRON 4 MG PO TBDI
ORAL_TABLET | Freq: Four times a day (QID) | 1 refills | PRN
Start: 2019-08-10 — End: ?

## 2019-08-10 MED ORDER — HUMIRA(CF) PEN 40 MG/0.4 ML SC PNKT
40 mg | PACK | SUBCUTANEOUS | 2 refills | Status: DC
Start: 2019-08-10 — End: 2019-08-11
  Filled 2019-08-17: qty 2, 28d supply, fill #1

## 2019-08-11 ENCOUNTER — Encounter
Admit: 2019-08-11 | Discharge: 2019-08-11 | Payer: BC Managed Care – PPO | Primary: Student in an Organized Health Care Education/Training Program

## 2019-08-11 MED ORDER — HUMIRA(CF) PEN 40 MG/0.4 ML SC PNKT
40 mg | PACK | SUBCUTANEOUS | 2 refills | Status: AC
Start: 2019-08-11 — End: ?

## 2019-08-15 ENCOUNTER — Encounter
Admit: 2019-08-15 | Discharge: 2019-08-15 | Payer: BC Managed Care – PPO | Primary: Student in an Organized Health Care Education/Training Program

## 2019-08-16 ENCOUNTER — Encounter
Admit: 2019-08-16 | Discharge: 2019-08-16 | Payer: BC Managed Care – PPO | Primary: Student in an Organized Health Care Education/Training Program

## 2019-08-16 MED ORDER — NAPROXEN 250 MG PO TAB
250 mg | ORAL_TABLET | Freq: Two times a day (BID) | ORAL | 3 refills | Status: DC | PRN
Start: 2019-08-16 — End: 2019-08-17

## 2019-08-16 MED ORDER — ONDANSETRON 4 MG PO TBDI
4 mg | ORAL_TABLET | ORAL | 3 refills | 8.00000 days | Status: DC | PRN
Start: 2019-08-16 — End: 2019-08-17
  Filled 2019-08-16: qty 30, 30d supply, fill #1

## 2019-08-17 ENCOUNTER — Encounter
Admit: 2019-08-17 | Discharge: 2019-08-17 | Payer: BC Managed Care – PPO | Primary: Student in an Organized Health Care Education/Training Program

## 2019-08-17 MED ORDER — ONDANSETRON 4 MG PO TBDI
4 mg | ORAL_TABLET | ORAL | 3 refills | 8.00000 days | Status: AC | PRN
Start: 2019-08-17 — End: ?

## 2019-08-17 MED ORDER — NAPROXEN 250 MG PO TAB
ORAL_TABLET | Freq: Two times a day (BID) | 3 refills
Start: 2019-08-17 — End: ?

## 2019-08-17 MED ORDER — NAPROXEN 250 MG PO TAB
250 mg | ORAL_TABLET | Freq: Two times a day (BID) | ORAL | 3 refills | Status: AC | PRN
Start: 2019-08-17 — End: ?
  Filled 2019-08-16: qty 60, 30d supply, fill #1

## 2019-08-17 MED ORDER — ONDANSETRON 4 MG PO TBDI
ORAL_TABLET | Freq: Four times a day (QID) | 3 refills | PRN
Start: 2019-08-17 — End: ?

## 2019-08-18 ENCOUNTER — Encounter
Admit: 2019-08-18 | Discharge: 2019-08-18 | Payer: BC Managed Care – PPO | Primary: Student in an Organized Health Care Education/Training Program
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
## 2019-09-22 ENCOUNTER — Encounter
Admit: 2019-09-22 | Discharge: 2019-09-22 | Payer: BC Managed Care – PPO | Primary: Student in an Organized Health Care Education/Training Program

## 2019-09-22 ENCOUNTER — Ambulatory Visit
Admit: 2019-09-22 | Discharge: 2019-09-22 | Payer: BC Managed Care – PPO | Primary: Student in an Organized Health Care Education/Training Program

## 2019-09-22 ENCOUNTER — Ambulatory Visit
Admit: 2019-09-22 | Discharge: 2019-09-22 | Payer: No Typology Code available for payment source | Primary: Student in an Organized Health Care Education/Training Program

## 2019-09-22 DIAGNOSIS — Z0000 Encounter for general adult medical examination without abnormal findings: Secondary | ICD-10-CM

## 2019-09-22 DIAGNOSIS — M797 Fibromyalgia: Secondary | ICD-10-CM

## 2019-09-22 DIAGNOSIS — M1990 Unspecified osteoarthritis, unspecified site: Secondary | ICD-10-CM

## 2019-09-22 DIAGNOSIS — R55 Syncope and collapse: Secondary | ICD-10-CM

## 2019-09-22 DIAGNOSIS — S069X9A Unspecified intracranial injury with loss of consciousness of unspecified duration, initial encounter: Secondary | ICD-10-CM

## 2019-09-22 DIAGNOSIS — M0230 Reiter's disease, unspecified site: Secondary | ICD-10-CM

## 2019-09-22 DIAGNOSIS — D849 Immunodeficiency, unspecified: Secondary | ICD-10-CM

## 2019-09-22 DIAGNOSIS — A4902 Methicillin resistant Staphylococcus aureus infection, unspecified site: Secondary | ICD-10-CM

## 2019-09-22 LAB — LIPID PROFILE
Lab: 106 mg/dL (ref <150)
Lab: 129 mg/dL — ABNORMAL HIGH (ref <100)
Lab: 210 mg/dL — ABNORMAL HIGH (ref <200)
Lab: 59 mg/dL (ref >40)

## 2019-09-22 LAB — COMPREHENSIVE METABOLIC PANEL
Lab: 0.7 mg/dL (ref 0.3–1.2)
Lab: 1 mg/dL (ref 0.4–1.24)
Lab: 103 MMOL/L (ref 98–110)
Lab: 139 MMOL/L (ref 137–147)
Lab: 18 mg/dL (ref 7–25)
Lab: 29 MMOL/L (ref 21–30)
Lab: 36 U/L (ref 7–40)
Lab: 4.1 MMOL/L (ref 3.5–5.1)
Lab: 4.7 g/dL (ref 3.5–5.0)
Lab: 54 U/L (ref 7–56)
Lab: 7.4 g/dL (ref 6.0–8.0)
Lab: 74 U/L (ref 25–110)
Lab: 9.8 mg/dL (ref 8.5–10.6)
Lab: 97 mg/dL (ref 70–100)
Lab: >60 mL/min (ref >60)
Lab: >60 mL/min (ref >60)
Lab: 7 (ref 3–12)

## 2019-09-22 LAB — TSH WITH FREE T4 REFLEX: Lab: 1.2 uU/mL (ref 0.35–5.00)

## 2019-09-22 MED ORDER — NAPROXEN 500 MG PO TAB
500 mg | ORAL_TABLET | Freq: Two times a day (BID) | ORAL | 3 refills | Status: AC
Start: 2019-09-22 — End: ?

## 2019-09-22 MED ORDER — OMEPRAZOLE 20 MG PO CPDR
20 mg | ORAL_CAPSULE | Freq: Every day | ORAL | 1 refills | Status: AC
Start: 2019-09-22 — End: ?

## 2019-09-22 MED ORDER — NAPROXEN 500 MG PO TAB
250 mg | ORAL_TABLET | Freq: Two times a day (BID) | ORAL | 1 refills | Status: DC | PRN
Start: 2019-09-22 — End: 2019-09-22

## 2019-09-24 ENCOUNTER — Encounter
Admit: 2019-09-24 | Discharge: 2019-09-24 | Payer: BC Managed Care – PPO | Primary: Student in an Organized Health Care Education/Training Program

## 2019-09-24 ENCOUNTER — Ambulatory Visit
Admit: 2019-09-24 | Discharge: 2019-09-25 | Payer: BC Managed Care – PPO | Primary: Student in an Organized Health Care Education/Training Program

## 2019-09-24 DIAGNOSIS — F331 Major depressive disorder, recurrent, moderate: Secondary | ICD-10-CM

## 2019-09-24 DIAGNOSIS — F4310 Post-traumatic stress disorder, unspecified: Secondary | ICD-10-CM

## 2019-09-24 DIAGNOSIS — M1990 Unspecified osteoarthritis, unspecified site: Secondary | ICD-10-CM

## 2019-09-24 DIAGNOSIS — S32010S Wedge compression fracture of first lumbar vertebra, sequela: Secondary | ICD-10-CM

## 2019-09-24 DIAGNOSIS — S069X9A Unspecified intracranial injury with loss of consciousness of unspecified duration, initial encounter: Secondary | ICD-10-CM

## 2019-09-24 DIAGNOSIS — A4902 Methicillin resistant Staphylococcus aureus infection, unspecified site: Secondary | ICD-10-CM

## 2019-09-24 DIAGNOSIS — F515 Nightmare disorder: Secondary | ICD-10-CM

## 2019-09-24 DIAGNOSIS — M797 Fibromyalgia: Secondary | ICD-10-CM

## 2019-09-24 DIAGNOSIS — Z1211 Encounter for screening for malignant neoplasm of colon: Secondary | ICD-10-CM

## 2019-09-24 MED ORDER — ATORVASTATIN 20 MG PO TAB
20 mg | ORAL_TABLET | Freq: Every day | ORAL | 0 refills | Status: AC
Start: 2019-09-24 — End: ?

## 2019-09-24 MED ORDER — PRAZOSIN 1 MG PO CAP
1 mg | ORAL_CAPSULE | Freq: Every evening | ORAL | 3 refills | Status: AC
Start: 2019-09-24 — End: ?

## 2019-09-24 NOTE — Patient Instructions
It was very nice to see you in clinic today:     -  -    ? Contact:   Please send a MyChart message to the General Medicine clinic or call our clinic nurse at [phone]  ? MyChart: You may sign up for MyChart to receive test results, contact our team with questions, or have prescription requests filled.   ? Use the following link to signup: [email or web address]  ? Medication refills:  Please use the MyChart Refill request or contact your pharmacy directly to request medication refills.  Please allow 72 hours.  ? Test results:  You will receive your test results in person at your appointment, or by MyChart if you are signed up. We prefer to discuss test results during your appointment time, so please try to have your labs done several days before your next routine visit.  If unable to discuss in person, your results will either be sent to you via MyChart, or by telephone or letter.  If you are expecting results and have not heard from my office within 2 weeks of your testing, please send a MyChart message or call my office.    ? Laboratory: Located on the 1st floor of the Medical Office Building.   ? Hours: Monday 6:30am-7pm & Tuesday-Friday 7 am-6pm     ? Radiology: Located on the 2nd floor of the Medical Office Building.  ? Scheduling:  Call [phone].  ? Same Day and evening appointments are available.  ? Please ask for an annual or yearly physical appointment if it has been over 1 year since our last appointment.  ? Cell phone appointment remiders: Provide our office with your cell phone number. Then text [facility] to [phone].  ? Support services: Offered for many chronic illnesses through [facility]: [email or web address] or [phone].  ? Urgent questions:  ? Nights, weekends, or holidays you may call the operator at [phone] and ask for the doctor on call for General Internal Medicine.    ? Call 911 for any emergencies.

## 2019-09-30 ENCOUNTER — Encounter
Admit: 2019-09-30 | Discharge: 2019-09-30 | Payer: BC Managed Care – PPO | Primary: Student in an Organized Health Care Education/Training Program

## 2019-09-30 ENCOUNTER — Ambulatory Visit
Admit: 2019-09-30 | Discharge: 2019-10-01 | Payer: No Typology Code available for payment source | Primary: Student in an Organized Health Care Education/Training Program

## 2019-09-30 DIAGNOSIS — A4902 Methicillin resistant Staphylococcus aureus infection, unspecified site: Secondary | ICD-10-CM

## 2019-09-30 DIAGNOSIS — M1990 Unspecified osteoarthritis, unspecified site: Secondary | ICD-10-CM

## 2019-09-30 DIAGNOSIS — S069X9A Unspecified intracranial injury with loss of consciousness of unspecified duration, initial encounter: Secondary | ICD-10-CM

## 2019-09-30 DIAGNOSIS — Z20822 Encounter for screening laboratory testing for COVID-19 virus in asymptomatic patient: Secondary | ICD-10-CM

## 2019-09-30 DIAGNOSIS — K625 Hemorrhage of anus and rectum: Secondary | ICD-10-CM

## 2019-09-30 DIAGNOSIS — K921 Melena: Secondary | ICD-10-CM

## 2019-09-30 DIAGNOSIS — R110 Nausea: Secondary | ICD-10-CM

## 2019-09-30 DIAGNOSIS — M797 Fibromyalgia: Secondary | ICD-10-CM

## 2019-09-30 DIAGNOSIS — R1310 Dysphagia, unspecified: Secondary | ICD-10-CM

## 2019-09-30 MED ORDER — PEG-ELECTROLYTE SOLN 420 GRAM PO SOLR
0 refills | Status: AC
Start: 2019-09-30 — End: ?

## 2019-09-30 NOTE — Progress Notes
Date of Service: 09/30/2019    Subjective:             Philip Bennett is a 49 y.o. male HLA-B27 positive spondyloarthropathy, fibromyalgia, PTSD who presents for evaluation of chronic nausea, dysphagia, loose stools, constipation, blood in stool, and abdominal pain.    History of Present Illness    The patient reports that he has had bowel issues for years. Today he discussed multiple different complaints. He endorses chronic nausea and vomiting. In the past, this was severe and he was sometimes unable to finish a meal without vomiting. Symptoms are currently improved. He still frequently feels nauseous but is no longer vomiting very frequently. He reports that he will sometimes have regurgitation first thing in the morning of foamy, acidic material with some chunks. This regurgitation does not seem to be related to eating. He also endorses dysphagia. This is mostly to solids, especially hard foods like chips. This currently happens only rarely, less than once per week. He tries to avoid hard foods. Of note, he is missing most of his teeth. He reports a previous period of 30+ pound weight loss when symptoms were more severe. He has now gained back most of the weight he lost. He was started on omeprazole 20 mg daily a week or two ago and thinks this has improved his symptoms.     In addition, the patient reports variable stools ranging from diarrhea to constipation. He reports having a bowel movement at least every 1-2 days, does not usually go longer than that without a stool. Stools are usually formed, sometimes pebbly and sometimes soft. He has noted dark loose stools in the past. He has also had spots of red blood in the stool and on the toilet paper. He occasionally has cramping pain before or during bowel movements. This pain often improves following a stool.    The patient denies previous abdominal surgeries. He has a past medical history of spondyloarthropathy and follows with rheumatology. Recently started on Humira. Also takes naproxen 500 mg BID. He also has fibromyalgia. He reports periods of extreme fatigue in which he is barely able to get out of bed for days at a time. Family history is notable for widespread metastatic cancer of unknown etiology in his father and Crohn's disease in one sister. He reports drinking beer most days, ranging from 1 to 8 beers daily. He smokes tobacco. Denies other drug use.       Review of Systems   Constitutional: Positive for fatigue.   HENT: Negative.    Eyes: Negative.    Respiratory: Negative.    Cardiovascular: Negative.    Gastrointestinal: Positive for abdominal distention, abdominal pain, anal bleeding, blood in stool, constipation, diarrhea, nausea, rectal pain and vomiting.   Endocrine: Negative.    Genitourinary: Negative.    Musculoskeletal: Negative.    Skin: Negative.    Allergic/Immunologic: Negative.    Neurological: Positive for weakness.   Hematological: Negative.    Psychiatric/Behavioral: Negative.    All other systems reviewed and are negative.    Medical History:   Diagnosis Date   ? Arthritis    ? Fibromyalgia    ? MRSA infection    ? TBI (traumatic brain injury) [facility])      Surgical History:   Procedure Laterality Date   ? FOOT SURGERY Right    ? HAND SURGERY Bilateral      Social History     Socioeconomic History   ? Marital status: Married  Spouse name: Not on file   ? Number of children: Not on file   ? Years of education: Not on file   ? Highest education level: Not on file   Occupational History   ? Not on file   Tobacco Use   ? Smoking status: Current Every Day Smoker     Packs/day: 1.50   ? Smokeless tobacco: Never Used   Substance and Sexual Activity   ? Alcohol use: Yes     Comment: occasionally    ? Drug use: No   ? Sexual activity: Not on file   Other Topics Concern   ? Not on file   Social History Narrative   ? Not on file     Family History   Problem Relation Age of Onset   ? Hypertension Mother    ? Cancer Father    ? Crohn's Disease Sister            Objective:         ? acetaminophen (TYLENOL) 500 mg tablet Take 500 mg by mouth every 6 hours as needed for Pain. Max of 4,000 mg of acetaminophen in 24 hours.   ? adalimumab (CF) (HUMIRA(CF) PEN) 40 mg/0.4 mL injection PEN kit Inject 0.4 mL under the skin every 14 days.   ? atorvastatin (LIPITOR) 20 mg tablet Take one tablet by mouth daily.   ? chlorhexidine (HIBICLENS) 4 % topical liquid Apply topically to body daily in the shower from the neck down   ? cholecalciferol (VITAMIN D-3) 400 unit tab tablet Take one tablet by mouth daily.   ? citalopram (CELEXA) 40 mg tablet Take one tablet by mouth daily. Indications: anxiousness associated with depression, major depressive disorder   ? clindamycin (CLINDA-DERM) 1 % topical solution apply to affected area on body in the morning daily after washing with water   ? mupirocin (BACTROBAN) 2 % topical ointment Apply topically to nares daily for 1 month   ? naproxen (NAPROSYN) 500 mg tablet Take one tablet by mouth twice daily with meals. Take with food.   ? omeprazole DR (PRILOSEC) 20 mg capsule Take one capsule by mouth daily before breakfast.   ? ondansetron (ZOFRAN ODT) 4 mg rapid dissolve tablet Dissolve one tablet by mouth every 8 hours as needed for Nausea or Vomiting. Place on tongue to disolve.   ? prazosin (MINIPRESS) 1 mg capsule Take one capsule by mouth at bedtime daily. Indications: posttraumatic stress syndrome     Vitals:    09/30/19 1256   BP: 103/73   BP Source: Arm, Left Upper   Patient Position: Sitting   Pulse: 73   Resp: 16   Weight: 78 kg (172 lb)   Height: 182.9 cm (72)   PainSc: Five     Body mass index is 23.33 kg/m?Marland Kitchen     Physical Exam  Vitals reviewed.   Constitutional:       General: He is not in acute distress.     Appearance: Normal appearance. He is normal weight. He is not ill-appearing.   HENT:      Mouth/Throat:      Mouth: Mucous membranes are moist.      Pharynx: Oropharynx is clear.      Comments: Missing most teeth  Eyes:      General: No scleral icterus.     Extraocular Movements: Extraocular movements intact.      Pupils: Pupils are equal, round, and reactive to light.   Cardiovascular:  Rate and Rhythm: Normal rate and regular rhythm.      Pulses: Normal pulses.   Pulmonary:      Effort: Pulmonary effort is normal.   Abdominal:      General: Abdomen is flat. There is no distension.      Palpations: Abdomen is soft. There is no mass.      Tenderness: There is no abdominal tenderness. There is no guarding.   Musculoskeletal:      Right lower leg: No edema.      Left lower leg: No edema.   Neurological:      General: No focal deficit present.      Mental Status: He is alert.              Assessment and Plan:  Philip Bennett is a 49 y.o. male HLA-B27 positive spondyloarthropathy, fibromyalgia, PTSD who presents for evaluation of chronic nausea, dysphagia, loose stools, constipation, blood in stool, and abdominal pain.    Chronic Nausea/Vomiting  GERD  Dysphagia  -Reports years of nausea with vomiting as well as regurgitation of acidic fluid  -Occasional dysphagia to solids only. Mostly to hard foods like chips. Nearly edentulous.  -Previously had severe symptoms causing 30+ pound weight loss but symptoms are improved recently. Has gained most of his weight back  -Reports prior melena  -On daily NSAIDs  -Recently started on omeprazole with improvement in symptoms  -No prior EGD    -Continue omeprazole 20 mg daily  -GERD lifestyle modifications  -Eat slowly and thoroughly cut/chew food  -EGD for further evaluation of symptoms    Diarrhea  Constipation  Blood in Stool  -Variable stool pattern ranging from diarrhea to constipation. Often has straining. Some abdominal pain that often resolves with BM  -Has had melena and bright red blood in the past  -Normal Hgb  -ESR/CRP previously normal  -TTG negative  -CT A/P w/contrast 07/2019 ([facility]): No intra-abdominal findings. Unremarkable liver, GB, pancreas, and bowel.  -No prior colonoscopy    -Likely IBS but with reported symptoms of blood in stool, weight loss and family history of IBD, recommend further evaluation with colonoscopy. Patient agreeable.  -If normal, would start trial of fiber and peppermint oil supplement.    RTC in 6 months    Discussed with Dr. Dolores Hoose, MD  GI Fellow  Pager [phone]

## 2019-10-01 ENCOUNTER — Encounter
Admit: 2019-10-01 | Discharge: 2019-10-01 | Payer: BC Managed Care – PPO | Primary: Student in an Organized Health Care Education/Training Program

## 2019-10-01 DIAGNOSIS — S069X9A Unspecified intracranial injury with loss of consciousness of unspecified duration, initial encounter: Secondary | ICD-10-CM

## 2019-10-01 DIAGNOSIS — R112 Nausea with vomiting, unspecified: Secondary | ICD-10-CM

## 2019-10-01 DIAGNOSIS — A4902 Methicillin resistant Staphylococcus aureus infection, unspecified site: Secondary | ICD-10-CM

## 2019-10-01 DIAGNOSIS — M1990 Unspecified osteoarthritis, unspecified site: Secondary | ICD-10-CM

## 2019-10-01 DIAGNOSIS — M797 Fibromyalgia: Secondary | ICD-10-CM

## 2019-10-04 ENCOUNTER — Ambulatory Visit
Admit: 2019-10-04 | Discharge: 2019-10-04 | Payer: BC Managed Care – PPO | Primary: Student in an Organized Health Care Education/Training Program

## 2019-10-04 DIAGNOSIS — K625 Hemorrhage of anus and rectum: Secondary | ICD-10-CM

## 2019-10-04 DIAGNOSIS — R1310 Dysphagia, unspecified: Secondary | ICD-10-CM

## 2019-10-08 ENCOUNTER — Encounter
Admit: 2019-10-08 | Discharge: 2019-10-08 | Payer: BC Managed Care – PPO | Primary: Student in an Organized Health Care Education/Training Program

## 2019-10-08 DIAGNOSIS — S32010S Wedge compression fracture of first lumbar vertebra, sequela: Secondary | ICD-10-CM

## 2019-10-17 ENCOUNTER — Encounter
Admit: 2019-10-17 | Discharge: 2019-10-17 | Payer: BC Managed Care – PPO | Primary: Student in an Organized Health Care Education/Training Program

## 2019-10-18 ENCOUNTER — Encounter
Admit: 2019-10-18 | Discharge: 2019-10-18 | Payer: BC Managed Care – PPO | Primary: Student in an Organized Health Care Education/Training Program

## 2019-10-18 MED FILL — HUMIRA(CF) PEN 40 MG/0.4 ML SC PNKT: 40 mg/0.4 mL | SUBCUTANEOUS | 28 days supply | Qty: 2 | Fill #2 | Status: AC

## 2019-10-19 ENCOUNTER — Encounter
Admit: 2019-10-19 | Discharge: 2019-10-19 | Payer: BC Managed Care – PPO | Primary: Student in an Organized Health Care Education/Training Program

## 2019-10-21 ENCOUNTER — Ambulatory Visit
Admit: 2019-10-21 | Discharge: 2019-10-21 | Payer: BC Managed Care – PPO | Primary: Student in an Organized Health Care Education/Training Program

## 2019-10-21 ENCOUNTER — Encounter
Admit: 2019-10-21 | Discharge: 2019-10-21 | Payer: BC Managed Care – PPO | Primary: Student in an Organized Health Care Education/Training Program

## 2019-10-21 DIAGNOSIS — M79671 Pain in right foot: Secondary | ICD-10-CM

## 2019-10-21 DIAGNOSIS — M797 Fibromyalgia: Secondary | ICD-10-CM

## 2019-10-21 DIAGNOSIS — S069X9A Unspecified intracranial injury with loss of consciousness of unspecified duration, initial encounter: Secondary | ICD-10-CM

## 2019-10-21 DIAGNOSIS — A4902 Methicillin resistant Staphylococcus aureus infection, unspecified site: Secondary | ICD-10-CM

## 2019-10-21 DIAGNOSIS — M1990 Unspecified osteoarthritis, unspecified site: Secondary | ICD-10-CM

## 2019-10-21 DIAGNOSIS — M7989 Other specified soft tissue disorders: Secondary | ICD-10-CM

## 2019-10-21 DIAGNOSIS — R6889 Other general symptoms and signs: Secondary | ICD-10-CM

## 2019-10-21 DIAGNOSIS — Z720 Tobacco use: Secondary | ICD-10-CM

## 2019-10-21 DIAGNOSIS — F331 Major depressive disorder, recurrent, moderate: Secondary | ICD-10-CM

## 2019-10-21 DIAGNOSIS — S32010S Wedge compression fracture of first lumbar vertebra, sequela: Secondary | ICD-10-CM

## 2019-10-21 DIAGNOSIS — I82411 Acute embolism and thrombosis of right femoral vein: Secondary | ICD-10-CM

## 2019-10-21 DIAGNOSIS — S22080A Wedge compression fracture of T11-T12 vertebra, initial encounter for closed fracture: Secondary | ICD-10-CM

## 2019-10-21 DIAGNOSIS — R208 Other disturbances of skin sensation: Secondary | ICD-10-CM

## 2019-10-21 DIAGNOSIS — Z23 Encounter for immunization: Secondary | ICD-10-CM

## 2019-10-21 LAB — CBC AND DIFF
Lab: 0 10*3/uL (ref 0–0.20)
Lab: 0.3 10*3/uL (ref 0–0.45)
Lab: 1 % (ref 0–2)
Lab: 1 10*3/uL — ABNORMAL HIGH (ref 0–0.80)
Lab: 10 % (ref 4–12)
Lab: 10 10*3/uL (ref 4.5–11.0)
Lab: 104 FL — ABNORMAL HIGH (ref 80–100)
Lab: 13 % (ref 11–15)
Lab: 13 g/dL — ABNORMAL LOW (ref 13.5–16.5)
Lab: 2.1 10*3/uL (ref 1.0–4.8)
Lab: 21 % — ABNORMAL LOW (ref 24–44)
Lab: 3 % (ref 0–5)
Lab: 3.6 M/UL — ABNORMAL LOW (ref 4.4–5.5)
Lab: 34 g/dL (ref 32.0–36.0)
Lab: 36 pg — ABNORMAL HIGH (ref 26–34)
Lab: 38 % — ABNORMAL LOW (ref 40–50)
Lab: 401 10*3/uL — ABNORMAL HIGH (ref 150–400)
Lab: 6.7 10*3/uL (ref 1.8–7.0)
Lab: 6.7 FL — ABNORMAL LOW (ref 7–11)

## 2019-10-21 MED ORDER — APIXABAN 5 MG PO TAB
10 mg | ORAL_TABLET | Freq: Two times a day (BID) | ORAL | 0 refills | Status: DC
Start: 2019-10-21 — End: 2019-10-21
  Filled 2019-10-21: qty 180, 90d supply, fill #1

## 2019-10-21 MED ORDER — APIXABAN 5 MG PO TAB
5 mg | ORAL_TABLET | Freq: Two times a day (BID) | ORAL | 0 refills | Status: AC
Start: 2019-10-21 — End: ?

## 2019-10-21 MED ORDER — APIXABAN 5 MG PO TAB
5 mg | ORAL_TABLET | Freq: Two times a day (BID) | ORAL | 0 refills | Status: DC
Start: 2019-10-21 — End: 2019-10-21
  Filled 2019-10-21: qty 28, 7d supply, fill #1

## 2019-10-21 MED ORDER — PRAZOSIN 2 MG PO CAP
2 mg | ORAL_CAPSULE | Freq: Every evening | ORAL | 3 refills | Status: AC
Start: 2019-10-21 — End: ?

## 2019-10-21 MED ORDER — APIXABAN 5 MG PO TAB
10 mg | ORAL_TABLET | Freq: Two times a day (BID) | ORAL | 0 refills | Status: AC
Start: 2019-10-21 — End: ?

## 2019-10-21 MED ORDER — THIAMINE HCL (VITAMIN B1) 50 MG PO TAB
100 mg | ORAL_TABLET | Freq: Every day | ORAL | 0 refills | Status: CN
Start: 2019-10-21 — End: ?

## 2019-10-22 ENCOUNTER — Encounter
Admit: 2019-10-22 | Discharge: 2019-10-22 | Payer: BC Managed Care – PPO | Primary: Student in an Organized Health Care Education/Training Program

## 2019-10-22 DIAGNOSIS — M797 Fibromyalgia: Secondary | ICD-10-CM

## 2019-10-22 DIAGNOSIS — S069X9A Unspecified intracranial injury with loss of consciousness of unspecified duration, initial encounter: Secondary | ICD-10-CM

## 2019-10-22 DIAGNOSIS — A4902 Methicillin resistant Staphylococcus aureus infection, unspecified site: Secondary | ICD-10-CM

## 2019-10-22 DIAGNOSIS — M1990 Unspecified osteoarthritis, unspecified site: Secondary | ICD-10-CM

## 2019-10-22 NOTE — Progress Notes
Called this patient for updates regarding picking up anticoagulant. Pt reported able to with no issues. No questions regarding directions for this medication at this time.

## 2019-10-25 ENCOUNTER — Encounter
Admit: 2019-10-25 | Discharge: 2019-10-25 | Payer: BC Managed Care – PPO | Primary: Student in an Organized Health Care Education/Training Program

## 2019-10-25 MED ORDER — CITALOPRAM 40 MG PO TAB
40 mg | ORAL_TABLET | Freq: Every day | ORAL | 1 refills | Status: AC
Start: 2019-10-25 — End: ?

## 2019-10-25 NOTE — Telephone Encounter
Medication(s) refilled per [facility] medication refill standing orders protocol. Celene Skeen, LPN

## 2019-11-02 ENCOUNTER — Encounter
Admit: 2019-11-02 | Discharge: 2019-11-02 | Payer: BC Managed Care – PPO | Primary: Student in an Organized Health Care Education/Training Program

## 2019-11-02 ENCOUNTER — Ambulatory Visit
Admit: 2019-11-02 | Discharge: 2019-11-02 | Payer: BC Managed Care – PPO | Primary: Student in an Organized Health Care Education/Training Program

## 2019-11-02 DIAGNOSIS — S32010S Wedge compression fracture of first lumbar vertebra, sequela: Secondary | ICD-10-CM

## 2019-11-02 DIAGNOSIS — S069X9A Unspecified intracranial injury with loss of consciousness of unspecified duration, initial encounter: Secondary | ICD-10-CM

## 2019-11-02 DIAGNOSIS — M1990 Unspecified osteoarthritis, unspecified site: Secondary | ICD-10-CM

## 2019-11-02 DIAGNOSIS — R52 Pain, unspecified: Secondary | ICD-10-CM

## 2019-11-02 DIAGNOSIS — M797 Fibromyalgia: Secondary | ICD-10-CM

## 2019-11-02 DIAGNOSIS — A4902 Methicillin resistant Staphylococcus aureus infection, unspecified site: Secondary | ICD-10-CM

## 2019-11-02 DIAGNOSIS — S22080D Wedge compression fracture of T11-T12 vertebra, subsequent encounter for fracture with routine healing: Secondary | ICD-10-CM

## 2019-11-02 DIAGNOSIS — M549 Dorsalgia, unspecified: Secondary | ICD-10-CM

## 2019-11-02 DIAGNOSIS — R413 Other amnesia: Secondary | ICD-10-CM

## 2019-11-02 DIAGNOSIS — R404 Transient alteration of awareness: Secondary | ICD-10-CM

## 2019-11-02 MED ORDER — LIDOCAINE 5 % TP PTMD
1 | MEDICATED_PATCH | Freq: Every day | TOPICAL | 0 refills | 30.00000 days | Status: AC
Start: 2019-11-02 — End: ?
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
## 2019-11-02 NOTE — Patient Instructions
It was a pleasure seeing you in clinic today.    Rick Hermon Zea RN, BSN  Clinical Nurse Coordinator  Dr. Raj Mitra/Physical Medicine & Rehab  [facility]  [facility]  [address]  [place], [place] [address]    Phone: [phone]  Fax [phone]  Scheduling to make, cancel or change appointment [phone]    For prescription refills, please contact your pharmacy.  Please allow 2-3 days for refill requests to be filled.      For more information on spinal conditions, please visit the following website:  [email or web address]

## 2019-11-03 ENCOUNTER — Encounter
Admit: 2019-11-03 | Discharge: 2019-11-03 | Payer: BC Managed Care – PPO | Primary: Student in an Organized Health Care Education/Training Program

## 2019-11-04 ENCOUNTER — Encounter
Admit: 2019-11-04 | Discharge: 2019-11-04 | Payer: BC Managed Care – PPO | Primary: Student in an Organized Health Care Education/Training Program

## 2019-11-08 ENCOUNTER — Encounter
Admit: 2019-11-08 | Discharge: 2019-11-08 | Payer: BC Managed Care – PPO | Primary: Student in an Organized Health Care Education/Training Program

## 2019-11-08 MED FILL — HUMIRA(CF) PEN 40 MG/0.4 ML SC PNKT: 40 mg/0.4 mL | SUBCUTANEOUS | 28 days supply | Qty: 1 | Fill #3 | Status: AC

## 2019-11-10 ENCOUNTER — Encounter
Admit: 2019-11-10 | Discharge: 2019-11-10 | Payer: BC Managed Care – PPO | Primary: Student in an Organized Health Care Education/Training Program

## 2019-11-16 ENCOUNTER — Encounter
Admit: 2019-11-16 | Discharge: 2019-11-16 | Payer: BC Managed Care – PPO | Primary: Student in an Organized Health Care Education/Training Program

## 2019-11-22 ENCOUNTER — Encounter
Admit: 2019-11-22 | Discharge: 2019-11-22 | Payer: BC Managed Care – PPO | Primary: Student in an Organized Health Care Education/Training Program

## 2019-11-22 ENCOUNTER — Ambulatory Visit
Admit: 2019-11-22 | Discharge: 2019-11-22 | Payer: BC Managed Care – PPO | Primary: Student in an Organized Health Care Education/Training Program

## 2019-11-22 DIAGNOSIS — R404 Transient alteration of awareness: Secondary | ICD-10-CM

## 2019-11-22 DIAGNOSIS — M549 Dorsalgia, unspecified: Secondary | ICD-10-CM

## 2019-11-22 DIAGNOSIS — R413 Other amnesia: Secondary | ICD-10-CM

## 2019-11-22 NOTE — Procedures
OUTPATIENT >1 HOUR EEG REPORT    Philip Bennett  [birth year]  [id number]    Date of service: 11/22/19    History: This is a 49 y.o. male presenting with memory difficulties and syncope    Pertinent medications: No pertinent medications    Introduction:  This study was performed using digital electroencephalographic recording equipment. International 10-20 electrode placement was used along with FT9 and FT10 electrodes. The record was obtained with the patient in the awake and asleep states.  Photic stimulation is performed.  Hyperventilation is not performed.  The study is performed from 0930 to 1037 on 11/22/19.    Description:   The posterior dominant rhythm is 9 Hz.     Drowsiness results in attenuation of the background and increased theta activity. Stage II of sleep is seen, as evidenced by presence of sleep spindles.    Intermittent photic stimulation induced bilateral symmetric photic driving.     There was no lateralizing or epileptiform discharges.    Clinical correlation;   This is a normal awake and asleep >1 hour EEG.  No epileptiform findings or lateralizing signs are seen.    Discussed with Dr. Holley Bouche M.D., PGY-4  Neurology Resident    I personally reviewed the EEG tracing. I agree with findings and clinical impression/interpretation as documented by the neurology resident Philip Bennett.    Philip Petties, MD  Associate Professor  [facility]  Department of Neurology

## 2019-11-22 NOTE — Progress Notes
Extended monitoring 65 minute outpatient EEG completed without complications. Recording began at 9:30:03.    Paperwork including the patient history and EEG information sheet was provided to the patient.     The test was explained to the patient in detail and all questions were answered regarding the performing of the EEG.     All electrodes were below 5,000kOhm and recording properly. Photic Stimulation was performed. Hyperventilation was not performed due to current ACNS guidelines.    After the test the scalp was inspected for any skin breakdown and was cleaned with warm water and  a washcloth. Education was given to the patient about proper hair care after the EEG.    The patient was educated to check in MyChart or contact the physician's office in approximately 2 weeks regarding the results of their EEG.

## 2019-11-24 ENCOUNTER — Encounter
Admit: 2019-11-24 | Discharge: 2019-11-24 | Payer: BC Managed Care – PPO | Primary: Student in an Organized Health Care Education/Training Program

## 2019-11-24 NOTE — Telephone Encounter
MA attempted to contact pt regarding his telehealth appointment he is scheduled for was unable to reach pt VM was left.

## 2019-11-25 ENCOUNTER — Encounter
Admit: 2019-11-25 | Discharge: 2019-11-25 | Payer: BC Managed Care – PPO | Primary: Student in an Organized Health Care Education/Training Program

## 2019-11-26 ENCOUNTER — Encounter
Admit: 2019-11-26 | Discharge: 2019-11-26 | Payer: BC Managed Care – PPO | Primary: Student in an Organized Health Care Education/Training Program

## 2019-11-29 ENCOUNTER — Encounter
Admit: 2019-11-29 | Discharge: 2019-11-29 | Payer: BC Managed Care – PPO | Primary: Student in an Organized Health Care Education/Training Program

## 2019-11-30 ENCOUNTER — Ambulatory Visit
Admit: 2019-11-30 | Discharge: 2019-12-01 | Payer: BC Managed Care – PPO | Primary: Student in an Organized Health Care Education/Training Program

## 2019-11-30 ENCOUNTER — Encounter
Admit: 2019-11-30 | Discharge: 2019-11-30 | Payer: BC Managed Care – PPO | Primary: Student in an Organized Health Care Education/Training Program

## 2019-11-30 DIAGNOSIS — I82411 Acute embolism and thrombosis of right femoral vein: Secondary | ICD-10-CM

## 2019-11-30 DIAGNOSIS — S069X9A Unspecified intracranial injury with loss of consciousness of unspecified duration, initial encounter: Secondary | ICD-10-CM

## 2019-11-30 DIAGNOSIS — M797 Fibromyalgia: Secondary | ICD-10-CM

## 2019-11-30 DIAGNOSIS — S32010S Wedge compression fracture of first lumbar vertebra, sequela: Secondary | ICD-10-CM

## 2019-11-30 DIAGNOSIS — A4902 Methicillin resistant Staphylococcus aureus infection, unspecified site: Secondary | ICD-10-CM

## 2019-11-30 DIAGNOSIS — S22080D Wedge compression fracture of T11-T12 vertebra, subsequent encounter for fracture with routine healing: Secondary | ICD-10-CM

## 2019-11-30 DIAGNOSIS — M1990 Unspecified osteoarthritis, unspecified site: Secondary | ICD-10-CM
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
## 2019-11-30 NOTE — Assessment & Plan Note
-  Presenting today with concerns of continued RLE swelling, warmth and pain  -Symptoms worse during the day after being on feet, improved after sleeping   -Has not tried compression stockings  -Reports compliance with apixaban without missed doses     -Exam with slight size discrepancy of the RLE compared to LLE, no tenderness, no lesions or rashes  -Vital signs: afebrile, no tachycardia, SpO2 98% on RA     PLAN:  > Reassured that the body takes some time to resorb blood clots and it is possible he still has residual clot with the extensiveness of his DVT  > Recommend continuing apixaban 5 mg BID and monitor clinically  > Advised that if continued symptoms at 3 months, can consider repeat U/S to see if residual clot is present and may need longer duration of anticoagulation  > Discussed that he should call PCP's office at 3 month mark and discuss further. May be possible to get imaging done locally and sent to PCP so that patient does not need to make trip to [place] (lives a few hours away)  > Recommend compression stockings bilaterally for swelling likely due to venous insufficiency

## 2019-11-30 NOTE — Progress Notes
I performed a history and physical examination of the patient and discussed the management with the resident.  I reviewed the resident's note and agree with the documented findings and plan of care.

## 2019-12-01 MED FILL — HUMIRA(CF) PEN 40 MG/0.4 ML SC PNKT: 40 mg/0.4 mL | SUBCUTANEOUS | 28 days supply | Qty: 2 | Fill #4 | Status: AC

## 2019-12-08 ENCOUNTER — Encounter
Admit: 2019-12-08 | Discharge: 2019-12-08 | Payer: BC Managed Care – PPO | Primary: Student in an Organized Health Care Education/Training Program

## 2019-12-09 ENCOUNTER — Encounter
Admit: 2019-12-09 | Discharge: 2019-12-09 | Payer: BC Managed Care – PPO | Primary: Student in an Organized Health Care Education/Training Program

## 2019-12-16 ENCOUNTER — Encounter
Admit: 2019-12-16 | Discharge: 2019-12-16 | Payer: BC Managed Care – PPO | Primary: Student in an Organized Health Care Education/Training Program

## 2019-12-16 NOTE — Telephone Encounter
Outbound call to [facility]. Spoke with Lab who verified they do accept outside physician lab orders for pre-procedure COVID testing. Order successfully faxed to [id number]. No appointment needed. Pt needs to drive to ER, call from car and a nurse will come out to collect the lab sample. They send it off to [facility], and PCR results in 24-48 hours.   Outbound call to pt. No answer. Recording came on saying the wireless customer was unavailable at this time, to try again later. Mychart message sent to pt.

## 2019-12-20 ENCOUNTER — Encounter
Admit: 2019-12-20 | Discharge: 2019-12-20 | Payer: BC Managed Care – PPO | Primary: Student in an Organized Health Care Education/Training Program

## 2019-12-22 ENCOUNTER — Encounter
Admit: 2019-12-22 | Discharge: 2019-12-22 | Payer: BC Managed Care – PPO | Primary: Student in an Organized Health Care Education/Training Program

## 2019-12-22 MED ORDER — PRAZOSIN 2 MG PO CAP
4 mg | ORAL_CAPSULE | Freq: Every evening | ORAL | 3 refills | Status: AC
Start: 2019-12-22 — End: ?

## 2019-12-22 MED FILL — HUMIRA(CF) PEN 40 MG/0.4 ML SC PNKT: 40 mg/0.4 mL | SUBCUTANEOUS | 28 days supply | Qty: 1 | Fill #5 | Status: AC

## 2019-12-22 NOTE — Telephone Encounter
Prazosin dose can be increased.  It comes in 1, 2, and 5mg  tablets.    Will order 4mg  at bedtime (two 2mg  capsules together).  Please also offer psychiatry referral.

## 2019-12-23 ENCOUNTER — Encounter
Admit: 2019-12-23 | Discharge: 2019-12-23 | Payer: BC Managed Care – PPO | Primary: Student in an Organized Health Care Education/Training Program

## 2019-12-24 ENCOUNTER — Encounter
Admit: 2019-12-24 | Discharge: 2019-12-24 | Payer: BC Managed Care – PPO | Primary: Student in an Organized Health Care Education/Training Program

## 2019-12-25 ENCOUNTER — Ambulatory Visit
Admit: 2019-12-25 | Discharge: 2019-12-25 | Payer: No Typology Code available for payment source | Primary: Student in an Organized Health Care Education/Training Program

## 2019-12-25 ENCOUNTER — Encounter
Admit: 2019-12-25 | Discharge: 2019-12-25 | Payer: BC Managed Care – PPO | Primary: Student in an Organized Health Care Education/Training Program

## 2019-12-25 DIAGNOSIS — R413 Other amnesia: Secondary | ICD-10-CM

## 2019-12-25 DIAGNOSIS — R404 Transient alteration of awareness: Secondary | ICD-10-CM

## 2020-01-09 NOTE — Patient Instructions
It was very nice to see you in clinic today:     -  -     Contact: Please send a MyChart message to the General Medicine clinic or call our clinic nurse at [phone]   MyChart: You may sign up for MyChart to receive test results, contact our team with questions, or have prescription requests filled.   ? Use the following link to signup: [email or web address]   Medication refills:  Please use the MyChart Refill request or contact your pharmacy directly to request medication refills.  Please allow 72 hours.   Test results:  You will receive your test results in person at your appointment, or by MyChart if you are signed up. We prefer to discuss test results during your appointment time, so please try to have your labs done several days before your next routine visit.  If unable to discuss in person, your results will either be sent to you via MyChart, or by telephone or letter.  If you are expecting results and have not heard from my office within 2 weeks of your testing, please send a MyChart message or call my office.     Laboratory: Located on the 1st floor of the Medical Office Building.   ? Hours: Monday 6:30am-7pm & Tuesday-Friday 7am-6pm      Radiology: Located on the 2nd floor of the Medical Office Building.   Scheduling:  Call [phone].  ? Same Day and evening appointments are available.  ? Please ask for an annual or yearly physical appointment if it has been over 1 year since our last appointment.   Cell phone appointment remiders: Provide our office with your cell phone number. Then text [place] to [phone].   Support services: Offered for many chronic illnesses through [facility]: [facility] or [phone].   Urgent questions:  ? Nights, weekends, or holidays you may call the operator at [phone] and ask for the doctor on call for General Internal Medicine.    ? Call 911 for any emergencies.
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
## 2020-01-10 ENCOUNTER — Encounter
Admit: 2020-01-10 | Discharge: 2020-01-10 | Payer: BC Managed Care – PPO | Primary: Student in an Organized Health Care Education/Training Program

## 2020-01-12 ENCOUNTER — Ambulatory Visit
Admit: 2020-01-12 | Discharge: 2020-01-12 | Payer: BC Managed Care – PPO | Primary: Student in an Organized Health Care Education/Training Program

## 2020-01-12 ENCOUNTER — Encounter
Admit: 2020-01-12 | Discharge: 2020-01-12 | Payer: BC Managed Care – PPO | Primary: Student in an Organized Health Care Education/Training Program

## 2020-01-12 ENCOUNTER — Encounter
Admit: 2019-09-30 | Discharge: 2019-09-30 | Payer: BC Managed Care – PPO | Primary: Student in an Organized Health Care Education/Training Program

## 2020-01-12 DIAGNOSIS — S069X9A Unspecified intracranial injury with loss of consciousness of unspecified duration, initial encounter: Secondary | ICD-10-CM

## 2020-01-12 DIAGNOSIS — M797 Fibromyalgia: Secondary | ICD-10-CM

## 2020-01-12 DIAGNOSIS — A4902 Methicillin resistant Staphylococcus aureus infection, unspecified site: Secondary | ICD-10-CM

## 2020-01-12 DIAGNOSIS — F32A Depression: Secondary | ICD-10-CM

## 2020-01-12 DIAGNOSIS — I2699 Other pulmonary embolism without acute cor pulmonale: Secondary | ICD-10-CM

## 2020-01-12 DIAGNOSIS — S32010S Wedge compression fracture of first lumbar vertebra, sequela: Secondary | ICD-10-CM

## 2020-01-12 DIAGNOSIS — M1990 Unspecified osteoarthritis, unspecified site: Secondary | ICD-10-CM

## 2020-01-12 DIAGNOSIS — F419 Anxiety disorder, unspecified: Secondary | ICD-10-CM

## 2020-01-12 DIAGNOSIS — S22080D Wedge compression fracture of T11-T12 vertebra, subsequent encounter for fracture with routine healing: Secondary | ICD-10-CM

## 2020-01-12 DIAGNOSIS — R519 Generalized headaches: Secondary | ICD-10-CM

## 2020-01-12 MED ORDER — PHENYLEPHRINE HCL IN 0.9% NACL 1 MG/10 ML (100 MCG/ML) IV SYRG
INTRAVENOUS | 0 refills | Status: DC
Start: 2020-01-12 — End: 2020-01-12
  Administered 2020-01-12: 17:00:00 100 ug via INTRAVENOUS

## 2020-01-12 MED ORDER — LIDOCAINE (PF) 20 MG/ML (2 %) IJ SOLN
INTRAVENOUS | 0 refills | Status: DC
Start: 2020-01-12 — End: 2020-01-12
  Administered 2020-01-12: 17:00:00 100 mg via INTRAVENOUS

## 2020-01-12 MED ORDER — PROPOFOL INJ 10 MG/ML IV VIAL
INTRAVENOUS | 0 refills | Status: DC
Start: 2020-01-12 — End: 2020-01-12
  Administered 2020-01-12: 17:00:00 50 mg via INTRAVENOUS

## 2020-01-12 MED ORDER — PROPOFOL 10 MG/ML IV EMUL 20 ML (INFUSION)(AM)(OR)
INTRAVENOUS | 0 refills | Status: DC
Start: 2020-01-12 — End: 2020-01-12
  Administered 2020-01-12: 17:00:00 130 ug/kg/min via INTRAVENOUS

## 2020-01-12 MED ORDER — LACTATED RINGERS IV SOLP
INTRAVENOUS | 0 refills | Status: DC
Start: 2020-01-12 — End: 2020-01-12
  Administered 2020-01-12: 16:00:00 via INTRAVENOUS

## 2020-01-12 MED ADMIN — STERILE WATER/SIMETHICONE IRRIGATION [210913]: 60 mL | @ 17:00:00 | Stop: 2020-01-12 | NDC 54029137209

## 2020-01-12 NOTE — Anesthesia Post-Procedure Evaluation
Post-Anesthesia Evaluation    Name: Philip Bennett      MRN: [id number]     DOB: 1970-12-29     Age: 49 y.o.     Sex: male   __________________________________________________________________________     Procedure Information     Anesthesia Start Date/Time: 01/12/20 1025    Procedures:       Colonoscopy (N/A )      ESOPHAGOGASTRODUODENOSCOPY WITH BIOPSY - FLEXIBLE (N/A )      ESOPHAGOGASTRODUODENOSCOPY WITH TRANSENDOSCOPIC BALLOON DILATION - LESS THAN 30 MM DIAMETER    Location: ENDO 4 / ENDO/GI    Surgeons: Onnie Boer, MD          Post-Anesthesia Vitals  BP: 113/80 (12/08 1120)  Temp: 36.9 C (98.5 F) (12/08 1109)  Pulse: 71 (12/08 1125)  Respirations: 14 PER MINUTE (12/08 1125)  SpO2: 99 % (12/08 1125)  SpO2 Pulse: 69 (12/08 1125)   Vitals Value Taken Time   BP 113/80 01/12/20 1120   Temp 36.9 C (98.5 F) 01/12/20 1109   Pulse 71 01/12/20 1125   Respirations 14 PER MINUTE 01/12/20 1125   SpO2 99 % 01/12/20 1125   ABP     ART BP           Post Anesthesia Evaluation Note    Evaluation location: pre/post  Patient participation: recovered; patient participated in evaluation  Level of consciousness: alert    Pain score: 0  Pain management: adequate    Hydration: normovolemia  Temperature: 36.0C - 38.4C  Airway patency: adequate    Perioperative Events       Post-op nausea and vomiting: no PONV    Postoperative Status  Cardiovascular status: hemodynamically stable  Respiratory status: spontaneous ventilation  Follow-up needed: none        Perioperative Events  Perioperative Event: No  Emergency Case Activation: No

## 2020-01-13 ENCOUNTER — Encounter
Admit: 2020-01-13 | Discharge: 2020-01-13 | Payer: BC Managed Care – PPO | Primary: Student in an Organized Health Care Education/Training Program

## 2020-01-13 ENCOUNTER — Ambulatory Visit
Admit: 2020-01-13 | Discharge: 2020-01-13 | Payer: BC Managed Care – PPO | Primary: Student in an Organized Health Care Education/Training Program

## 2020-01-13 DIAGNOSIS — S069X9A Unspecified intracranial injury with loss of consciousness of unspecified duration, initial encounter: Secondary | ICD-10-CM

## 2020-01-13 DIAGNOSIS — F419 Anxiety disorder, unspecified: Secondary | ICD-10-CM

## 2020-01-13 DIAGNOSIS — M1990 Unspecified osteoarthritis, unspecified site: Secondary | ICD-10-CM

## 2020-01-13 DIAGNOSIS — R519 Generalized headaches: Secondary | ICD-10-CM

## 2020-01-13 DIAGNOSIS — S22080D Wedge compression fracture of T11-T12 vertebra, subsequent encounter for fracture with routine healing: Secondary | ICD-10-CM

## 2020-01-13 DIAGNOSIS — S32010S Wedge compression fracture of first lumbar vertebra, sequela: Secondary | ICD-10-CM

## 2020-01-13 DIAGNOSIS — F32A Depression: Secondary | ICD-10-CM

## 2020-01-13 DIAGNOSIS — I2699 Other pulmonary embolism without acute cor pulmonale: Secondary | ICD-10-CM

## 2020-01-13 DIAGNOSIS — A4902 Methicillin resistant Staphylococcus aureus infection, unspecified site: Secondary | ICD-10-CM

## 2020-01-13 DIAGNOSIS — I82411 Acute embolism and thrombosis of right femoral vein: Secondary | ICD-10-CM

## 2020-01-13 DIAGNOSIS — M797 Fibromyalgia: Secondary | ICD-10-CM

## 2020-01-13 MED ORDER — APIXABAN 5 MG PO TAB
5 mg | ORAL_TABLET | Freq: Two times a day (BID) | ORAL | 0 refills | Status: AC
Start: 2020-01-13 — End: ?

## 2020-01-14 ENCOUNTER — Encounter
Admit: 2020-01-14 | Discharge: 2020-01-14 | Payer: BC Managed Care – PPO | Primary: Student in an Organized Health Care Education/Training Program

## 2020-01-14 DIAGNOSIS — M1990 Unspecified osteoarthritis, unspecified site: Secondary | ICD-10-CM

## 2020-01-14 DIAGNOSIS — I2699 Other pulmonary embolism without acute cor pulmonale: Secondary | ICD-10-CM

## 2020-01-14 DIAGNOSIS — M797 Fibromyalgia: Secondary | ICD-10-CM

## 2020-01-14 DIAGNOSIS — A4902 Methicillin resistant Staphylococcus aureus infection, unspecified site: Secondary | ICD-10-CM

## 2020-01-14 DIAGNOSIS — F419 Anxiety disorder, unspecified: Secondary | ICD-10-CM

## 2020-01-14 DIAGNOSIS — F32A Depression: Secondary | ICD-10-CM

## 2020-01-14 DIAGNOSIS — S22080D Wedge compression fracture of T11-T12 vertebra, subsequent encounter for fracture with routine healing: Secondary | ICD-10-CM

## 2020-01-14 DIAGNOSIS — R519 Generalized headaches: Secondary | ICD-10-CM

## 2020-01-14 DIAGNOSIS — S069X9A Unspecified intracranial injury with loss of consciousness of unspecified duration, initial encounter: Secondary | ICD-10-CM

## 2020-01-14 DIAGNOSIS — S32010S Wedge compression fracture of first lumbar vertebra, sequela: Secondary | ICD-10-CM

## 2020-01-14 MED FILL — HUMIRA(CF) PEN 40 MG/0.4 ML SC PNKT: 40 mg/0.4 mL | SUBCUTANEOUS | 28 days supply | Qty: 2 | Fill #6 | Status: AC

## 2020-01-17 ENCOUNTER — Encounter
Admit: 2020-01-17 | Discharge: 2020-01-17 | Payer: BC Managed Care – PPO | Primary: Student in an Organized Health Care Education/Training Program

## 2020-01-18 ENCOUNTER — Encounter
Admit: 2020-01-18 | Discharge: 2020-01-18 | Payer: BC Managed Care – PPO | Primary: Student in an Organized Health Care Education/Training Program

## 2020-02-02 ENCOUNTER — Encounter
Admit: 2020-02-02 | Discharge: 2020-02-02 | Payer: No Typology Code available for payment source | Primary: Student in an Organized Health Care Education/Training Program

## 2020-02-02 MED ORDER — HUMIRA(CF) PEN 40 MG/0.4 ML SC PNKT
40 mg | PACK | SUBCUTANEOUS | 2 refills
Start: 2020-02-02 — End: ?

## 2020-02-03 ENCOUNTER — Encounter
Admit: 2020-02-03 | Discharge: 2020-02-03 | Payer: No Typology Code available for payment source | Primary: Student in an Organized Health Care Education/Training Program

## 2020-02-08 ENCOUNTER — Encounter
Admit: 2020-02-08 | Discharge: 2020-02-08 | Payer: No Typology Code available for payment source | Primary: Student in an Organized Health Care Education/Training Program

## 2020-02-08 MED ORDER — CYCLOBENZAPRINE 10 MG PO TAB
10 mg | ORAL_TABLET | Freq: Two times a day (BID) | ORAL | 1 refills | 30.00000 days | Status: AC | PRN
Start: 2020-02-08 — End: ?

## 2020-02-10 ENCOUNTER — Encounter
Admit: 2020-02-10 | Discharge: 2020-02-10 | Payer: No Typology Code available for payment source | Primary: Student in an Organized Health Care Education/Training Program

## 2020-02-11 ENCOUNTER — Encounter
Admit: 2020-02-11 | Discharge: 2020-02-11 | Payer: No Typology Code available for payment source | Primary: Student in an Organized Health Care Education/Training Program

## 2020-02-16 ENCOUNTER — Encounter
Admit: 2020-02-16 | Discharge: 2020-02-16 | Payer: No Typology Code available for payment source | Primary: Student in an Organized Health Care Education/Training Program

## 2020-02-17 ENCOUNTER — Encounter
Admit: 2020-02-17 | Discharge: 2020-02-17 | Payer: No Typology Code available for payment source | Primary: Student in an Organized Health Care Education/Training Program

## 2020-02-18 ENCOUNTER — Encounter
Admit: 2020-02-18 | Discharge: 2020-02-18 | Payer: No Typology Code available for payment source | Primary: Student in an Organized Health Care Education/Training Program

## 2020-02-18 NOTE — Progress Notes
The Prior Authorization for Humira was denied for [employer]  The appeal materials have been sent to the ambulatory clinical pharmacist via email to complete.  Completed appeal materials should be faxed to [phone] per insurance instructions.  Please contact the specialty pharmacy with any questions regarding next steps.    Thompson Caul  Pharmacy Patient Advocate  [phone]

## 2020-02-22 ENCOUNTER — Encounter
Admit: 2020-02-22 | Discharge: 2020-02-22 | Payer: No Typology Code available for payment source | Primary: Student in an Organized Health Care Education/Training Program

## 2020-02-23 ENCOUNTER — Ambulatory Visit
Admit: 2020-02-23 | Discharge: 2020-02-23 | Payer: No Typology Code available for payment source | Primary: Student in an Organized Health Care Education/Training Program

## 2020-02-23 ENCOUNTER — Encounter
Admit: 2020-02-23 | Discharge: 2020-02-23 | Payer: No Typology Code available for payment source | Primary: Student in an Organized Health Care Education/Training Program

## 2020-02-23 DIAGNOSIS — R4189 Other symptoms and signs involving cognitive functions and awareness: Secondary | ICD-10-CM

## 2020-02-23 DIAGNOSIS — R413 Other amnesia: Secondary | ICD-10-CM

## 2020-02-23 DIAGNOSIS — R404 Transient alteration of awareness: Secondary | ICD-10-CM

## 2020-02-23 NOTE — Progress Notes
Humira denied. Requiring disease activity score. Called and completed peer to peer. Now approved. Notified pharmacy.

## 2020-02-24 ENCOUNTER — Encounter
Admit: 2020-02-24 | Discharge: 2020-02-24 | Payer: No Typology Code available for payment source | Primary: Student in an Organized Health Care Education/Training Program

## 2020-02-24 MED FILL — HUMIRA(CF) PEN 40 MG/0.4 ML SC PNKT: 40 mg/0.4 mL | SUBCUTANEOUS | 28 days supply | Qty: 2 | Fill #1 | Status: AC

## 2020-02-24 NOTE — Progress Notes
The Prior Authorization PEER to PEER for Humira was approved for Philip Bennett until 02/22/21.  The copay is $0.00.  The PA authorization number is [id number].    The medication will be delivered to patient's prescription address per the patient's request.    Thompson Caul  Pharmacy Patient Advocate  [phone]

## 2020-03-08 ENCOUNTER — Encounter
Admit: 2020-03-08 | Discharge: 2020-03-08 | Payer: No Typology Code available for payment source | Primary: Student in an Organized Health Care Education/Training Program

## 2020-03-08 DIAGNOSIS — S32010S Wedge compression fracture of first lumbar vertebra, sequela: Secondary | ICD-10-CM

## 2020-03-08 DIAGNOSIS — A4902 Methicillin resistant Staphylococcus aureus infection, unspecified site: Secondary | ICD-10-CM

## 2020-03-08 DIAGNOSIS — M797 Fibromyalgia: Secondary | ICD-10-CM

## 2020-03-08 DIAGNOSIS — F32A Depression: Secondary | ICD-10-CM

## 2020-03-08 DIAGNOSIS — F419 Anxiety disorder, unspecified: Secondary | ICD-10-CM

## 2020-03-08 DIAGNOSIS — M1990 Unspecified osteoarthritis, unspecified site: Secondary | ICD-10-CM

## 2020-03-08 DIAGNOSIS — S22080D Wedge compression fracture of T11-T12 vertebra, subsequent encounter for fracture with routine healing: Secondary | ICD-10-CM

## 2020-03-08 DIAGNOSIS — S069X9A Unspecified intracranial injury with loss of consciousness of unspecified duration, initial encounter: Secondary | ICD-10-CM

## 2020-03-08 DIAGNOSIS — R519 Generalized headaches: Secondary | ICD-10-CM

## 2020-03-08 DIAGNOSIS — I2699 Other pulmonary embolism without acute cor pulmonale: Secondary | ICD-10-CM

## 2020-03-12 ENCOUNTER — Encounter
Admit: 2020-03-12 | Discharge: 2020-03-12 | Payer: No Typology Code available for payment source | Primary: Student in an Organized Health Care Education/Training Program

## 2020-03-12 DIAGNOSIS — F32A Depression: Secondary | ICD-10-CM

## 2020-03-12 DIAGNOSIS — R519 Generalized headaches: Secondary | ICD-10-CM

## 2020-03-12 DIAGNOSIS — A4902 Methicillin resistant Staphylococcus aureus infection, unspecified site: Secondary | ICD-10-CM

## 2020-03-12 DIAGNOSIS — F419 Anxiety disorder, unspecified: Secondary | ICD-10-CM

## 2020-03-12 DIAGNOSIS — M797 Fibromyalgia: Secondary | ICD-10-CM

## 2020-03-12 DIAGNOSIS — S32010S Wedge compression fracture of first lumbar vertebra, sequela: Secondary | ICD-10-CM

## 2020-03-12 DIAGNOSIS — M1990 Unspecified osteoarthritis, unspecified site: Secondary | ICD-10-CM

## 2020-03-12 DIAGNOSIS — S069X9A Unspecified intracranial injury with loss of consciousness of unspecified duration, initial encounter: Secondary | ICD-10-CM

## 2020-03-12 DIAGNOSIS — I2699 Other pulmonary embolism without acute cor pulmonale: Secondary | ICD-10-CM

## 2020-03-12 DIAGNOSIS — S22080D Wedge compression fracture of T11-T12 vertebra, subsequent encounter for fracture with routine healing: Secondary | ICD-10-CM

## 2020-03-13 ENCOUNTER — Encounter
Admit: 2020-03-13 | Discharge: 2020-03-13 | Payer: No Typology Code available for payment source | Primary: Student in an Organized Health Care Education/Training Program

## 2020-03-13 MED ORDER — OMEPRAZOLE 20 MG PO CPDR
20 mg | ORAL_CAPSULE | Freq: Every day | ORAL | 1 refills
Start: 2020-03-13 — End: ?

## 2020-03-17 MED ORDER — OMEPRAZOLE 20 MG PO CPDR
20 mg | ORAL_CAPSULE | Freq: Every day | ORAL | 1 refills | Status: AC
Start: 2020-03-17 — End: ?

## 2020-03-17 NOTE — Telephone Encounter
Refill request from patient for Prilosec.  Last filled 09/22/19 #90 with 1 refills  LOV 01/13/20  NOV not scheduled  This medication is a Standing Order.  Refilled per protocol.  Lind Covert, RN

## 2020-03-21 MED ORDER — CYCLOBENZAPRINE 10 MG PO TAB
10 mg | ORAL_TABLET | Freq: Two times a day (BID) | ORAL | 5 refills | 30.00000 days | Status: AC | PRN
Start: 2020-03-21 — End: ?

## 2020-03-21 NOTE — Telephone Encounter
Pharmacy requesting a script for: cyclobenzaprine  Last filled: 02/08/20 #30 X1  LOV: 01/13/2020  NOV: no future appointment   Medication not on standing orders.  Routing to Dr. Jari Favre for approval.   Harvel Ricks, LPN

## 2020-03-22 MED FILL — HUMIRA(CF) PEN 40 MG/0.4 ML SC PNKT: 40 mg/0.4 mL | SUBCUTANEOUS | 28 days supply | Qty: 2 | Fill #2 | Status: AC

## 2020-03-28 ENCOUNTER — Encounter
Admit: 2020-03-28 | Discharge: 2020-03-28 | Payer: No Typology Code available for payment source | Primary: Student in an Organized Health Care Education/Training Program

## 2020-03-30 ENCOUNTER — Encounter
Admit: 2020-03-30 | Discharge: 2020-03-30 | Payer: No Typology Code available for payment source | Primary: Student in an Organized Health Care Education/Training Program

## 2020-03-30 MED ORDER — CITALOPRAM 40 MG PO TAB
40 mg | ORAL_TABLET | Freq: Every day | ORAL | 1 refills | Status: AC
Start: 2020-03-30 — End: ?

## 2020-03-30 MED ORDER — NAPROXEN 500 MG PO TAB
500 mg | ORAL_TABLET | Freq: Two times a day (BID) | ORAL | 3 refills
Start: 2020-03-30 — End: ?

## 2020-03-31 ENCOUNTER — Encounter
Admit: 2020-03-31 | Discharge: 2020-03-31 | Payer: No Typology Code available for payment source | Primary: Student in an Organized Health Care Education/Training Program

## 2020-03-31 NOTE — Telephone Encounter
-----   Message from Claudell Kyle, APRN-NP sent at 03/08/2020 10:31 PM CST -----  ,Do you mind calling the patients PCP and see what labs have been done recently and request records. Also, if you can ask if they would be ok either changing or adding cymbalta, lyrica or higher doses of gabapentin to his current regimen of celexa and minipress. Would like to be able to treat his fibromyalgia and neuropathic pain if possible. De Nurse, APRN NP-CThe University of [facility] SystemRheumatology ClinicPh. [phone]. [phone]

## 2020-04-02 ENCOUNTER — Encounter
Admit: 2020-04-02 | Discharge: 2020-04-02 | Payer: No Typology Code available for payment source | Primary: Student in an Organized Health Care Education/Training Program

## 2020-04-04 ENCOUNTER — Encounter
Admit: 2020-04-04 | Discharge: 2020-04-04 | Payer: No Typology Code available for payment source | Primary: Student in an Organized Health Care Education/Training Program

## 2020-04-05 NOTE — Telephone Encounter
Insurance wants to know if the FMLA paperwork has been signed and when it will be done.     Routing to Dr. Jari Favre and Dr. Latrelle Dodrill, LPN

## 2020-04-28 ENCOUNTER — Encounter
Admit: 2020-04-28 | Discharge: 2020-04-28 | Payer: No Typology Code available for payment source | Primary: Student in an Organized Health Care Education/Training Program

## 2020-04-28 MED FILL — HUMIRA(CF) PEN 40 MG/0.4 ML SC PNKT: 40 mg/0.4 mL | SUBCUTANEOUS | 28 days supply | Qty: 2 | Fill #3 | Status: AC

## 2020-05-05 ENCOUNTER — Ambulatory Visit
Admit: 2020-05-05 | Discharge: 2020-05-05 | Payer: No Typology Code available for payment source | Primary: Student in an Organized Health Care Education/Training Program

## 2020-05-05 ENCOUNTER — Encounter
Admit: 2020-05-05 | Discharge: 2020-05-05 | Payer: No Typology Code available for payment source | Primary: Student in an Organized Health Care Education/Training Program

## 2020-05-05 DIAGNOSIS — R252 Cramp and spasm: Secondary | ICD-10-CM

## 2020-05-05 DIAGNOSIS — F419 Anxiety disorder, unspecified: Secondary | ICD-10-CM

## 2020-05-05 DIAGNOSIS — Z5181 Encounter for therapeutic drug level monitoring: Secondary | ICD-10-CM

## 2020-05-05 DIAGNOSIS — K921 Melena: Secondary | ICD-10-CM

## 2020-05-05 DIAGNOSIS — F32A Depression: Secondary | ICD-10-CM

## 2020-05-05 DIAGNOSIS — S22080D Wedge compression fracture of T11-T12 vertebra, subsequent encounter for fracture with routine healing: Secondary | ICD-10-CM

## 2020-05-05 DIAGNOSIS — M797 Fibromyalgia: Secondary | ICD-10-CM

## 2020-05-05 DIAGNOSIS — I2699 Other pulmonary embolism without acute cor pulmonale: Secondary | ICD-10-CM

## 2020-05-05 DIAGNOSIS — R1310 Dysphagia, unspecified: Secondary | ICD-10-CM

## 2020-05-05 DIAGNOSIS — S069X9A Unspecified intracranial injury with loss of consciousness of unspecified duration, initial encounter: Secondary | ICD-10-CM

## 2020-05-05 DIAGNOSIS — R197 Diarrhea, unspecified: Secondary | ICD-10-CM

## 2020-05-05 DIAGNOSIS — M1990 Unspecified osteoarthritis, unspecified site: Secondary | ICD-10-CM

## 2020-05-05 DIAGNOSIS — R1084 Generalized abdominal pain: Secondary | ICD-10-CM

## 2020-05-05 DIAGNOSIS — S32010S Wedge compression fracture of first lumbar vertebra, sequela: Secondary | ICD-10-CM

## 2020-05-05 DIAGNOSIS — R110 Nausea: Secondary | ICD-10-CM

## 2020-05-05 DIAGNOSIS — A4902 Methicillin resistant Staphylococcus aureus infection, unspecified site: Secondary | ICD-10-CM

## 2020-05-05 DIAGNOSIS — R519 Generalized headaches: Secondary | ICD-10-CM

## 2020-05-05 DIAGNOSIS — M0239 Reiter's disease, multiple sites: Secondary | ICD-10-CM

## 2020-05-05 LAB — CBC AND DIFF
Lab: 0 K/UL (ref 0–0.20)
Lab: 0.1 K/UL (ref 0–0.45)
Lab: 0.5 K/UL (ref 0–0.80)
Lab: 1 % (ref 0–2)
Lab: 1.8 K/UL (ref 1.0–4.8)
Lab: 12 % (ref 4–12)
Lab: 13 % (ref 11–15)
Lab: 2 % (ref >60)
Lab: 2 K/UL (ref 1.8–7.0)
Lab: 260 K/UL (ref 150–400)
Lab: 35 g/dL (ref 32.0–36.0)
Lab: 36 pg — ABNORMAL HIGH (ref 26–34)
Lab: 4 M/UL — ABNORMAL LOW (ref 4.4–5.5)
Lab: 4.6 K/UL (ref 4.5–11.0)
Lab: 41 % (ref 24–44)
Lab: 41 % (ref 40–50)
Lab: 44 % (ref 41–77)
Lab: 6.6 FL — ABNORMAL LOW (ref 7–11)

## 2020-05-05 LAB — C REACTIVE PROTEIN (CRP): Lab: 0.1 mg/dL (ref <1.0)

## 2020-05-05 LAB — COMPREHENSIVE METABOLIC PANEL
Lab: 138 MMOL/L (ref 137–147)
Lab: 4.4 MMOL/L (ref 3.5–5.1)

## 2020-05-05 LAB — CREATINE KINASE-CPK: Lab: 95 U/L (ref 35–232)

## 2020-05-05 LAB — VITAMIN B12: Lab: 121 pg/mL — ABNORMAL HIGH (ref 180–914)

## 2020-05-05 LAB — SED RATE: Lab: 4 mm/h (ref 0–15)

## 2020-05-05 MED ORDER — HYOSCYAMINE SULFATE 0.125 MG PO TBDI
125 ug | ORAL_TABLET | SUBLINGUAL | 0 refills | Status: AC | PRN
Start: 2020-05-05 — End: ?

## 2020-05-06 ENCOUNTER — Encounter
Admit: 2020-05-06 | Discharge: 2020-05-06 | Payer: No Typology Code available for payment source | Primary: Student in an Organized Health Care Education/Training Program

## 2020-05-08 ENCOUNTER — Encounter
Admit: 2020-05-08 | Discharge: 2020-05-08 | Payer: No Typology Code available for payment source | Primary: Student in an Organized Health Care Education/Training Program

## 2020-05-08 DIAGNOSIS — R7989 Other specified abnormal findings of blood chemistry: Secondary | ICD-10-CM

## 2020-05-08 MED ORDER — CHOLECALCIFEROL (VITAMIN D3) 1,250 MCG (50,000 UNIT) PO CAP
50000 [IU] | ORAL_CAPSULE | ORAL | 0 refills | 84.00000 days | Status: AC
Start: 2020-05-08 — End: ?

## 2020-05-20 ENCOUNTER — Encounter
Admit: 2020-05-20 | Discharge: 2020-05-20 | Payer: No Typology Code available for payment source | Primary: Student in an Organized Health Care Education/Training Program

## 2020-05-22 ENCOUNTER — Encounter
Admit: 2020-05-22 | Discharge: 2020-05-22 | Payer: No Typology Code available for payment source | Primary: Student in an Organized Health Care Education/Training Program

## 2020-05-22 DIAGNOSIS — F419 Anxiety disorder, unspecified: Secondary | ICD-10-CM

## 2020-05-22 DIAGNOSIS — F32A Depression: Secondary | ICD-10-CM

## 2020-05-22 DIAGNOSIS — S32010S Wedge compression fracture of first lumbar vertebra, sequela: Secondary | ICD-10-CM

## 2020-05-22 DIAGNOSIS — A4902 Methicillin resistant Staphylococcus aureus infection, unspecified site: Secondary | ICD-10-CM

## 2020-05-22 DIAGNOSIS — I2699 Other pulmonary embolism without acute cor pulmonale: Secondary | ICD-10-CM

## 2020-05-22 DIAGNOSIS — M1990 Unspecified osteoarthritis, unspecified site: Secondary | ICD-10-CM

## 2020-05-22 DIAGNOSIS — M797 Fibromyalgia: Secondary | ICD-10-CM

## 2020-05-22 DIAGNOSIS — S069X9A Unspecified intracranial injury with loss of consciousness of unspecified duration, initial encounter: Secondary | ICD-10-CM

## 2020-05-22 DIAGNOSIS — R519 Generalized headaches: Secondary | ICD-10-CM

## 2020-05-22 DIAGNOSIS — S22080D Wedge compression fracture of T11-T12 vertebra, subsequent encounter for fracture with routine healing: Secondary | ICD-10-CM

## 2020-05-23 ENCOUNTER — Encounter
Admit: 2020-05-23 | Discharge: 2020-05-23 | Payer: No Typology Code available for payment source | Primary: Student in an Organized Health Care Education/Training Program

## 2020-05-24 ENCOUNTER — Encounter
Admit: 2020-05-24 | Discharge: 2020-05-24 | Payer: No Typology Code available for payment source | Primary: Student in an Organized Health Care Education/Training Program

## 2020-05-24 MED FILL — HUMIRA(CF) PEN 40 MG/0.4 ML SC PNKT: 40 mg/0.4 mL | SUBCUTANEOUS | 28 days supply | Qty: 1 | Fill #4 | Status: AC

## 2020-05-24 NOTE — Patient Instructions
--   If you are having acute (new/sudden onset) or severe/worsening neurologic symptoms, please call 911 or seek care in ED.    -- If you have internet access/smart phone please contact me through MyChart. This is our preferred method of contact and the most efficient way to contact your healthcare team. If you do not have internet access/smart phone you can call at [phone].   -- Preferred method of communication is through [employer], if the issue cannot wait until your next scheduled follow up.   -- MyChart may be used for non-emergent communication. Emails are not reviewed after hours or over the weekend/holidays/after 4PM. Staff will reply to your email within 24-48 business hours.     -- Please do not leave multiple voicemails for [place], leaving multiple voicemails delay [place] in getting back to you quicker.    -- If you do not hear from [place] within one week of a lab or imaging study being completed, please call/send my chart email to the office to be sure that we have received the results. This is especially challenging when tests are done outside of the [facility] system, as many times results do not make it back to our office for a variety of reasons. In our office no news is good news does not apply. You should hear from [place] with results for each test.    -- For referrals placed during the visit, if you have not heard from scheduling within one week, please call the call center at [phone] to get scheduling assistance.  -- For radiology referrals placed during the visit, if you have not heard from scheduling within one week, please call the call center at [phone] to get scheduling assistance.    -- For medication refills, please first contact your pharmacy, who will fax a refill authorization request form to our office.  Weekdays only. Allow up to 2 business days for refills. Please plan ahead.  -- Allow one business week for our office to complete any requested paperwork (FMLA, etc.)     -- Our front desk staff, Samul Dada, or Marcelino Duster may be reached at [phone] for scheduling needs.   Huntley Dec, RN, may be contacted at [phone] for urgent needs. Staff will return your call within 24 business hours.     For Appointments:   -- Please try to arrive early for your appointment time to help facilitate your visit.   -- If you are late to your appointment, we reserve the right to ask you to reschedule or wait until next available time to be seen in fairness to other patients scheduled that day.   -- There are times when we are running behind in clinic. Our goal is to always be on time, however, there are time when unexpected events occur with patients, which may cause a delay. We appreciate your understanding when this occurs.  -- Please stop at the front desk after each visit to make sure we schedule your follow-up visit and print any referrals or labs for you.

## 2020-05-25 ENCOUNTER — Encounter
Admit: 2020-05-25 | Discharge: 2020-05-25 | Payer: No Typology Code available for payment source | Primary: Student in an Organized Health Care Education/Training Program

## 2020-05-25 ENCOUNTER — Ambulatory Visit
Admit: 2020-05-25 | Discharge: 2020-05-26 | Payer: No Typology Code available for payment source | Primary: Student in an Organized Health Care Education/Training Program

## 2020-05-25 DIAGNOSIS — S069X9A Unspecified intracranial injury with loss of consciousness of unspecified duration, initial encounter: Secondary | ICD-10-CM

## 2020-05-25 DIAGNOSIS — F32A Depression: Secondary | ICD-10-CM

## 2020-05-25 DIAGNOSIS — I2699 Other pulmonary embolism without acute cor pulmonale: Secondary | ICD-10-CM

## 2020-05-25 DIAGNOSIS — S32010S Wedge compression fracture of first lumbar vertebra, sequela: Secondary | ICD-10-CM

## 2020-05-25 DIAGNOSIS — M1990 Unspecified osteoarthritis, unspecified site: Secondary | ICD-10-CM

## 2020-05-25 DIAGNOSIS — R519 Generalized headaches: Secondary | ICD-10-CM

## 2020-05-25 DIAGNOSIS — S22080D Wedge compression fracture of T11-T12 vertebra, subsequent encounter for fracture with routine healing: Secondary | ICD-10-CM

## 2020-05-25 DIAGNOSIS — M797 Fibromyalgia: Secondary | ICD-10-CM

## 2020-05-25 DIAGNOSIS — A4902 Methicillin resistant Staphylococcus aureus infection, unspecified site: Secondary | ICD-10-CM

## 2020-05-25 DIAGNOSIS — R413 Other amnesia: Secondary | ICD-10-CM

## 2020-05-25 DIAGNOSIS — F419 Anxiety disorder, unspecified: Secondary | ICD-10-CM
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
## 2020-06-21 ENCOUNTER — Encounter
Admit: 2020-06-21 | Discharge: 2020-06-21 | Payer: No Typology Code available for payment source | Primary: Student in an Organized Health Care Education/Training Program

## 2020-06-21 MED FILL — HUMIRA(CF) PEN 40 MG/0.4 ML SC PNKT: 40 mg/0.4 mL | SUBCUTANEOUS | 28 days supply | Qty: 1 | Fill #5 | Status: AC

## 2020-06-22 ENCOUNTER — Encounter: Admit: 2020-06-22 | Discharge: 2020-06-22 | Payer: No Typology Code available for payment source

## 2020-06-26 ENCOUNTER — Encounter: Admit: 2020-06-26 | Discharge: 2020-06-26 | Payer: No Typology Code available for payment source

## 2020-06-26 ENCOUNTER — Ambulatory Visit: Admit: 2020-06-26 | Discharge: 2020-06-27 | Payer: No Typology Code available for payment source

## 2020-06-26 DIAGNOSIS — I2699 Other pulmonary embolism without acute cor pulmonale: Secondary | ICD-10-CM

## 2020-06-26 DIAGNOSIS — F419 Anxiety disorder, unspecified: Secondary | ICD-10-CM

## 2020-06-26 DIAGNOSIS — M1990 Unspecified osteoarthritis, unspecified site: Secondary | ICD-10-CM

## 2020-06-26 DIAGNOSIS — S32010S Wedge compression fracture of first lumbar vertebra, sequela: Secondary | ICD-10-CM

## 2020-06-26 DIAGNOSIS — S069X9A Unspecified intracranial injury with loss of consciousness of unspecified duration, initial encounter: Secondary | ICD-10-CM

## 2020-06-26 DIAGNOSIS — A4902 Methicillin resistant Staphylococcus aureus infection, unspecified site: Secondary | ICD-10-CM

## 2020-06-26 DIAGNOSIS — S22080D Wedge compression fracture of T11-T12 vertebra, subsequent encounter for fracture with routine healing: Secondary | ICD-10-CM

## 2020-06-26 DIAGNOSIS — R519 Generalized headaches: Secondary | ICD-10-CM

## 2020-06-26 DIAGNOSIS — M797 Fibromyalgia: Secondary | ICD-10-CM

## 2020-06-26 DIAGNOSIS — W540XXA Bitten by dog, initial encounter: Secondary | ICD-10-CM

## 2020-06-26 DIAGNOSIS — F32A Depression: Secondary | ICD-10-CM

## 2020-06-26 NOTE — Progress Notes
Gen Med Nurse Pre-visit Plan:          Patient hospitalized since last office visit: No.    POC testing or orders needed at office visit: No.    Health Maintenance Due   Topic Date Due    COVID-19 VACCINE (1) Never done    DTAP/TDAP VACCINES (2 - Td or Tdap) 04/13/2015        Notes to provider:  - Post hospital follow up   - Dog bite and fx right wrist

## 2020-06-26 NOTE — Patient Instructions
.  Please do not hesitate to call if you have any problems or questions.  My nurse can be reached at [phone].  You may also message [place] in MyChart.  For urgent issues after business hours/weekends/holidays call [phone] and request for the outpatient internal medicine physician to be paged.  We offer same day appointments for your acute health concerns.  Please call [phone] if you would like to make an appointment.  If I am not available, you can see any of my partners or try to see me the next day.      We have placed a referral for you to see Orthopedics. If you do not hear from scheduling you can call [phone] to follow up on your referral status and schedule an appointment.

## 2020-06-27 ENCOUNTER — Encounter: Admit: 2020-06-27 | Discharge: 2020-06-27 | Payer: No Typology Code available for payment source

## 2020-06-27 DIAGNOSIS — S6991XA Unspecified injury of right wrist, hand and finger(s), initial encounter: Secondary | ICD-10-CM

## 2020-06-27 DIAGNOSIS — S62101A Fracture of unspecified carpal bone, right wrist, initial encounter for closed fracture: Secondary | ICD-10-CM

## 2020-06-28 ENCOUNTER — Ambulatory Visit: Admit: 2020-06-28 | Discharge: 2020-06-29 | Payer: No Typology Code available for payment source

## 2020-06-28 ENCOUNTER — Encounter: Admit: 2020-06-28 | Discharge: 2020-06-28 | Payer: No Typology Code available for payment source

## 2020-06-28 DIAGNOSIS — S32010S Wedge compression fracture of first lumbar vertebra, sequela: Secondary | ICD-10-CM

## 2020-06-28 DIAGNOSIS — M797 Fibromyalgia: Secondary | ICD-10-CM

## 2020-06-28 DIAGNOSIS — I2699 Other pulmonary embolism without acute cor pulmonale: Secondary | ICD-10-CM

## 2020-06-28 DIAGNOSIS — F419 Anxiety disorder, unspecified: Secondary | ICD-10-CM

## 2020-06-28 DIAGNOSIS — A4902 Methicillin resistant Staphylococcus aureus infection, unspecified site: Secondary | ICD-10-CM

## 2020-06-28 DIAGNOSIS — S22080D Wedge compression fracture of T11-T12 vertebra, subsequent encounter for fracture with routine healing: Secondary | ICD-10-CM

## 2020-06-28 DIAGNOSIS — S6991XA Unspecified injury of right wrist, hand and finger(s), initial encounter: Secondary | ICD-10-CM

## 2020-06-28 DIAGNOSIS — R519 Generalized headaches: Secondary | ICD-10-CM

## 2020-06-28 DIAGNOSIS — S52571A Other intraarticular fracture of lower end of right radius, initial encounter for closed fracture: Secondary | ICD-10-CM

## 2020-06-28 DIAGNOSIS — F32A Depression: Secondary | ICD-10-CM

## 2020-06-28 DIAGNOSIS — S069X9A Unspecified intracranial injury with loss of consciousness of unspecified duration, initial encounter: Secondary | ICD-10-CM

## 2020-06-28 DIAGNOSIS — M1990 Unspecified osteoarthritis, unspecified site: Secondary | ICD-10-CM

## 2020-06-28 MED ORDER — NAPROXEN 500 MG PO TAB
500 mg | ORAL_TABLET | Freq: Two times a day (BID) | ORAL | 1 refills | Status: AC
Start: 2020-06-28 — End: ?

## 2020-06-28 NOTE — Patient Instructions
Please rescheduke follow-up appointment in 1 month.    It was a pleasure seeing you today. Please contact us if you have any further questions, we are happy to assist.     Please call our scheduling line at [phone] if you need to schedule another visit.  Please call our nursing line at [phone] for any nursing/medical/casting or splinting questions. Do not hesitate to contact our office with any further questions.Thank you and have a great day!    Philomena Course, MD   [facility]   Assistant Professor - Hand Surgery   Office: [phone]  Fax: [phone]    ______________________________________________________________________________________________________

## 2020-06-28 NOTE — Progress Notes
Date of Service: 06/28/2020    Subjective:            Right wrist pain    History of Present Illness  This is a 50 year old man presenting approximately 10 days after a dog bite injury to his right forearm.  He was trying to restrain his dog and had his right wrist twisted while doing so.  He was seen at an outside facility.  He has been taking some antibiotics.  He continues to have pain around the wrist with stiffness and swelling.  He denies any sensory disturbances.  He was treated in a splint.  Philip Bennett is a 50 y.o. male.     Review of Systems      Objective:         ? acetaminophen (TYLENOL) 500 mg tablet Take 500 mg by mouth every 6 hours as needed for Pain. Max of 4,000 mg of acetaminophen in 24 hours.   ? adalimumab (HUMIRA(CF) PEN) 40 mg/0.4 mL injection PEN kit Inject 0.4 mL under the skin every 14 days.   ? amoxicillin-potassium clavulanate (AUGMENTIN) 875/125 mg tablet Take 1 tablet by mouth every 12 hours. Take with food.   ? atorvastatin (LIPITOR) 20 mg tablet Take one tablet by mouth daily.   ? chlorhexidine (HIBICLENS) 4 % topical liquid Apply topically to body daily in the shower from the neck down   ? cholecalciferol (VITAMIN D-3) 400 unit tab tablet Take one tablet by mouth daily.   ? citalopram (CELEXA) 40 mg tablet Take one tablet by mouth daily. Indications: anxiousness associated with depression, major depressive disorder   ? clindamycin (CLEOCIN) 300 mg capsule Take 300 mg by mouth every 6 hours. Take with 8oz of water.   ? clindamycin (CLINDA-DERM) 1 % topical solution apply to affected area on body in the morning daily after washing with water   ? cyclobenzaprine (FLEXERIL) 10 mg tablet Take one tablet by mouth twice daily as needed for Muscle Cramps.   ? HYDROcodone/acetaminophen (NORCO) 5/325 mg tablet Take 1 tablet by mouth every 6 hours as needed for Pain.   ? hyoscyamine (ANASPAZ) 0.125 mg rapid dissolve tablet Place one tablet under tongue every 4 hours as needed for Cramps. Indications: irritable colon   ? mupirocin (BACTROBAN) 2 % topical ointment Apply topically to nares daily for 1 month   ? naproxen (NAPROSYN) 500 mg tablet Take one tablet by mouth twice daily with meals. Take with food.   ? omeprazole DR (PRILOSEC) 20 mg capsule Take one capsule by mouth daily before breakfast.   ? ondansetron (ZOFRAN ODT) 4 mg rapid dissolve tablet Dissolve one tablet by mouth every 8 hours as needed for Nausea or Vomiting. Place on tongue to disolve.   ? peg-electrolyte solution (NULYTELY) 420 gram oral solution Split dose by mouth as directed by GI Provider.   ? prazosin (MINIPRESS) 2 mg capsule Take two capsules by mouth at bedtime daily. Indications: posttraumatic stress syndrome     There were no vitals filed for this visit.  There is no height or weight on file to calculate BMI.     Physical Exam  Constitutional:       General: He is not in acute distress.     Appearance: Normal appearance.   Neurological:      Mental Status: He is oriented to person, place, and time.   Psychiatric:         Behavior: Behavior normal.       Ortho  Exam  Right forearm and wrist: There are 2 puncture wounds on the dorsal aspect of the proximal forearm with no surrounding erythema or drainage or tenderness.  He has some swelling and ecchymoses in the area of the distal radius with no clinical deformity.  There is isolated tenderness to the area of the radial styloid.  Range of motion of the wrist is somewhat limited secondary to pain.  He can make a full fist and fully extend all fingers.  There is intact sensation and brisk cap refill to all digits.       Assessment and Plan:  I reviewed his radiographs which show evidence of a nondisplaced intra-articular fracture of the distal radial styloid.  This appears to be a stable injury and should do fine with closed treatment.  I would recommend a wrist brace for comfort as well as some oral anti-inflammatories.  I encouraged follow-up in 1 month if he is having trouble.

## 2020-06-29 DIAGNOSIS — S62101A Fracture of unspecified carpal bone, right wrist, initial encounter for closed fracture: Secondary | ICD-10-CM

## 2020-07-14 ENCOUNTER — Encounter: Admit: 2020-07-14 | Discharge: 2020-07-14 | Payer: No Typology Code available for payment source

## 2020-07-14 MED ORDER — HUMIRA(CF) PEN 40 MG/0.4 ML SC PNKT
40 mg | PACK | SUBCUTANEOUS | 2 refills
Start: 2020-07-14 — End: ?

## 2020-07-14 MED FILL — HUMIRA(CF) PEN 40 MG/0.4 ML SC PNKT: 40 mg/0.4 mL | SUBCUTANEOUS | 28 days supply | Qty: 1 | Fill #6 | Status: AC

## 2020-07-14 NOTE — Telephone Encounter
Pharmacy is requesting a refill of Humira. Pt last seen 03/08/20. Pt to follow up in 3 months. Per last OV "Continue Humira 40 mg subcutaneous every 2 weeks." Mychart sent to pt requesting follow up visit.     Routing to [employer], APRN for review.

## 2020-07-17 ENCOUNTER — Encounter: Admit: 2020-07-17 | Discharge: 2020-07-17 | Payer: No Typology Code available for payment source

## 2020-07-24 ENCOUNTER — Encounter: Admit: 2020-07-24 | Discharge: 2020-07-24 | Payer: No Typology Code available for payment source

## 2020-07-24 NOTE — Progress Notes
Second attempt to reach Betsy Coder for annual reassessment to verify compliance and assess tolerance of his specialty medications (Humira). No answer. Left voicemail asking patient to return call to pharmacist at [phone]. First attempt made via MyChart message.    Prentiss Bells, PHARMD

## 2020-07-31 ENCOUNTER — Encounter: Admit: 2020-07-31 | Discharge: 2020-07-31 | Payer: No Typology Code available for payment source

## 2020-08-04 ENCOUNTER — Encounter: Admit: 2020-08-04 | Discharge: 2020-08-04 | Payer: No Typology Code available for payment source

## 2020-08-09 ENCOUNTER — Encounter: Admit: 2020-08-09 | Discharge: 2020-08-09 | Payer: No Typology Code available for payment source

## 2020-08-10 MED FILL — HUMIRA(CF) PEN 40 MG/0.4 ML SC PNKT: 40 mg/0.4 mL | SUBCUTANEOUS | 28 days supply | Qty: 1 | Fill #1 | Status: AC

## 2020-08-28 ENCOUNTER — Encounter: Admit: 2020-08-28 | Discharge: 2020-08-28 | Payer: No Typology Code available for payment source

## 2020-08-28 MED ORDER — OMEPRAZOLE 20 MG PO CPDR
20 mg | ORAL_CAPSULE | Freq: Every day | ORAL | 1 refills | Status: AC
Start: 2020-08-28 — End: ?

## 2020-08-30 ENCOUNTER — Encounter: Admit: 2020-08-30 | Discharge: 2020-08-30 | Payer: No Typology Code available for payment source

## 2020-08-31 ENCOUNTER — Encounter: Admit: 2020-08-31 | Discharge: 2020-08-31 | Payer: No Typology Code available for payment source

## 2020-08-31 MED FILL — HUMIRA(CF) PEN 40 MG/0.4 ML SC PNKT: 40 mg/0.4 mL | SUBCUTANEOUS | 28 days supply | Qty: 1 | Fill #2 | Status: AC

## 2020-09-01 ENCOUNTER — Encounter: Admit: 2020-09-01 | Discharge: 2020-09-01 | Payer: No Typology Code available for payment source

## 2020-09-02 ENCOUNTER — Encounter: Admit: 2020-09-02 | Discharge: 2020-09-02 | Payer: No Typology Code available for payment source

## 2020-09-02 MED ORDER — HYOSCYAMINE SULFATE 0.125 MG PO TBDI
125 ug | ORAL_TABLET | SUBLINGUAL | 0 refills | PRN
Start: 2020-09-02 — End: ?

## 2020-09-21 ENCOUNTER — Encounter: Admit: 2020-09-21 | Discharge: 2020-09-21 | Payer: No Typology Code available for payment source

## 2020-09-21 ENCOUNTER — Ambulatory Visit: Admit: 2020-09-21 | Discharge: 2020-09-21 | Payer: No Typology Code available for payment source

## 2020-09-21 DIAGNOSIS — S069X9A Unspecified intracranial injury with loss of consciousness of unspecified duration, initial encounter: Secondary | ICD-10-CM

## 2020-09-21 DIAGNOSIS — R109 Unspecified abdominal pain: Secondary | ICD-10-CM

## 2020-09-21 DIAGNOSIS — K219 Gastro-esophageal reflux disease without esophagitis: Secondary | ICD-10-CM

## 2020-09-21 DIAGNOSIS — F419 Anxiety disorder, unspecified: Secondary | ICD-10-CM

## 2020-09-21 DIAGNOSIS — S22080D Wedge compression fracture of T11-T12 vertebra, subsequent encounter for fracture with routine healing: Secondary | ICD-10-CM

## 2020-09-21 DIAGNOSIS — R1310 Dysphagia, unspecified: Principal | ICD-10-CM

## 2020-09-21 DIAGNOSIS — R112 Nausea with vomiting, unspecified: Secondary | ICD-10-CM

## 2020-09-21 DIAGNOSIS — R519 Generalized headaches: Secondary | ICD-10-CM

## 2020-09-21 DIAGNOSIS — M1990 Unspecified osteoarthritis, unspecified site: Secondary | ICD-10-CM

## 2020-09-21 DIAGNOSIS — A4902 Methicillin resistant Staphylococcus aureus infection, unspecified site: Secondary | ICD-10-CM

## 2020-09-21 DIAGNOSIS — M797 Fibromyalgia: Secondary | ICD-10-CM

## 2020-09-21 DIAGNOSIS — S32010S Wedge compression fracture of first lumbar vertebra, sequela: Secondary | ICD-10-CM

## 2020-09-21 DIAGNOSIS — I2699 Other pulmonary embolism without acute cor pulmonale: Secondary | ICD-10-CM

## 2020-09-21 DIAGNOSIS — F32A Depression: Secondary | ICD-10-CM

## 2020-09-21 MED ORDER — ONDANSETRON 4 MG PO TBDI
4 mg | ORAL_TABLET | ORAL | 1 refills | 8.00000 days | Status: AC | PRN
Start: 2020-09-21 — End: ?

## 2020-09-21 MED ORDER — PANTOPRAZOLE 40 MG PO TBEC
40 mg | ORAL_TABLET | Freq: Every day | ORAL | 1 refills | 90.00000 days | Status: AC
Start: 2020-09-21 — End: ?

## 2020-09-21 MED ORDER — HYOSCYAMINE SULFATE 0.125 MG PO TBDI
125 ug | ORAL_TABLET | SUBLINGUAL | 3 refills | Status: AC | PRN
Start: 2020-09-21 — End: ?

## 2020-10-20 ENCOUNTER — Encounter: Admit: 2020-10-20 | Discharge: 2020-10-20 | Payer: No Typology Code available for payment source

## 2020-10-20 MED ORDER — CYCLOBENZAPRINE 10 MG PO TAB
10 mg | ORAL_TABLET | Freq: Two times a day (BID) | ORAL | 5 refills | PRN
Start: 2020-10-20 — End: ?

## 2020-10-20 NOTE — Telephone Encounter
Some refill protocol elements NOT Met  Medication name: flexeril  Medication Strength: 10 mg    Last Fill Date: 03/21/20    Non-delegated medication    Routed to Provider

## 2020-10-20 NOTE — Telephone Encounter
Requested Prescriptions   Pending Prescriptions Disp Refills    cyclobenzaprine (FLEXERIL) 10 mg tablet [Pharmacy Med Name: Cyclobenzaprine HCl Oral Tablet 10 MG 10 MG  Tablet] 60 tablet 5     Sig: TAKE ONE TABLET BY MOUTH TWICE DAILY AS NEEDED FOR MUSCLE CRAMPS.       Analgesics: Muscle Relaxants Failed - 10/20/2020  2:59 PM        Failed - This refill cannot be delegated        Passed - Valid encounter within last 6 months     Recent Visits  Date Type Provider Dept   06/26/20 Office Visit Sherian Maroon, MBBS [facility]   01/13/20 Office Visit Telehealth Tacy Learn, DO [facility]   11/30/19 Office Visit Donzetta Sprung, MD [facility]   10/21/19 Office Visit Tacy Learn, DO [facility]   09/24/19 Office Visit Telehealth Tacy Learn, DO [facility]   07/29/19 Office Visit Tacy Learn, DO [facility]   Showing recent visits within past 720 days and meeting all other requirements  Future Appointments  No visits were found meeting these conditions.  Showing future appointments within next 360 days and meeting all other requirements            Passed - Patient is < 48 years old. Beers Criteria: Use with caution in patients 65+

## 2020-10-21 ENCOUNTER — Encounter: Admit: 2020-10-21 | Discharge: 2020-10-21 | Payer: No Typology Code available for payment source

## 2020-10-23 MED FILL — HUMIRA(CF) PEN 40 MG/0.4 ML SC PNKT: 40 mg/0.4 mL | SUBCUTANEOUS | 28 days supply | Qty: 1 | Fill #3 | Status: AC

## 2020-11-01 ENCOUNTER — Encounter: Admit: 2020-11-01 | Discharge: 2020-11-01 | Payer: No Typology Code available for payment source

## 2020-11-01 MED ORDER — NAPROXEN 500 MG PO TAB
500 mg | ORAL_TABLET | Freq: Two times a day (BID) | ORAL | 0 refills
Start: 2020-11-01 — End: ?

## 2020-11-02 NOTE — Telephone Encounter
Some refill protocol elements NOT Met  Medication name: Naproxen  Medication Strength: 520m tablet      Off protocol   This refill cannot be delegated    Routed to Provider     LTruitt Leep RN

## 2020-11-06 ENCOUNTER — Encounter: Admit: 2020-11-06 | Discharge: 2020-11-06 | Payer: No Typology Code available for payment source

## 2020-11-06 MED ORDER — CITALOPRAM 40 MG PO TAB
ORAL_TABLET | Freq: Every day | 0 refills | Status: AC
Start: 2020-11-06 — End: ?

## 2020-11-07 ENCOUNTER — Encounter: Admit: 2020-11-07 | Discharge: 2020-11-07 | Payer: No Typology Code available for payment source

## 2020-11-07 MED ORDER — CYCLOBENZAPRINE 10 MG PO TAB
10 mg | ORAL_TABLET | Freq: Two times a day (BID) | ORAL | 5 refills | PRN
Start: 2020-11-07 — End: ?

## 2020-11-07 NOTE — Telephone Encounter
Some refill protocol elements NOT Met  Medication name: Flexeril  Medication Strength: 34m tablet      Off protocol   This refill cannot be delegated    Routed to Provider     LTruitt Leep RN

## 2020-11-15 ENCOUNTER — Encounter: Admit: 2020-11-15 | Discharge: 2020-11-15 | Payer: No Typology Code available for payment source

## 2020-11-16 MED FILL — HUMIRA(CF) PEN 40 MG/0.4 ML SC PNKT: 40 mg/0.4 mL | SUBCUTANEOUS | 28 days supply | Qty: 1 | Fill #4 | Status: AC
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
## 2020-11-24 IMAGING — DX Ankle & Foot
3 series · 3 of 3 positions shown · non-contrast
Comparison: none

Procedure(s): XR foot RT min 3V

RADIOLOGIC EXAM: Right foot 3 views
INDICATION: Right foot pain and swelling

[AP (1 of 2)]
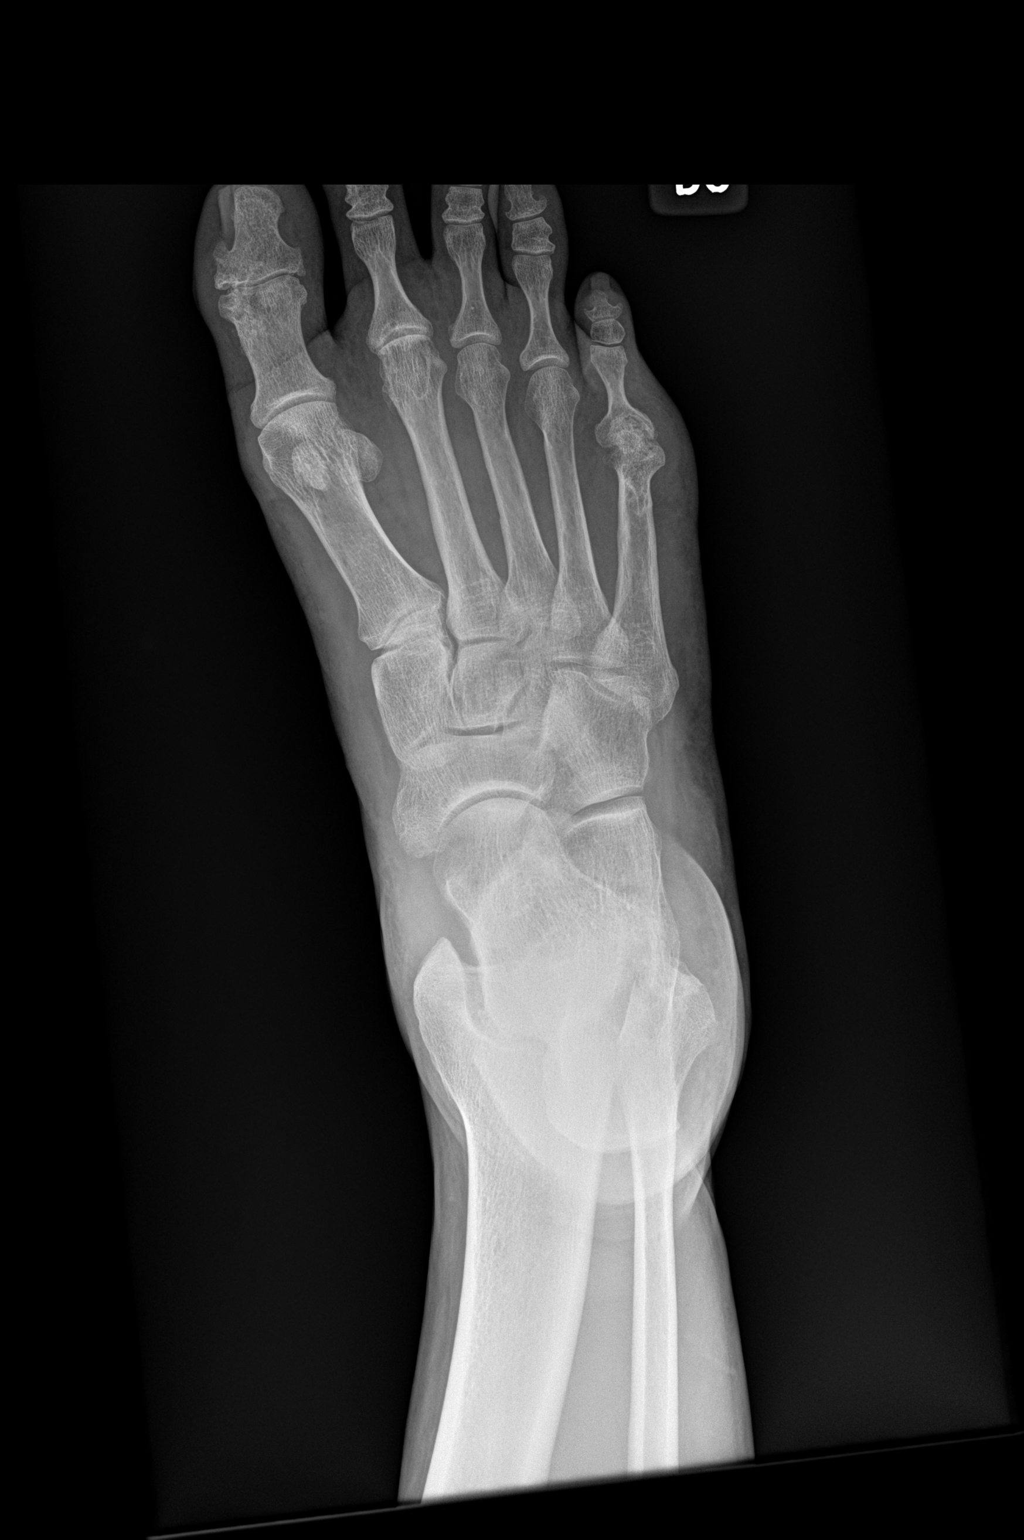

[AP (2 of 2)]
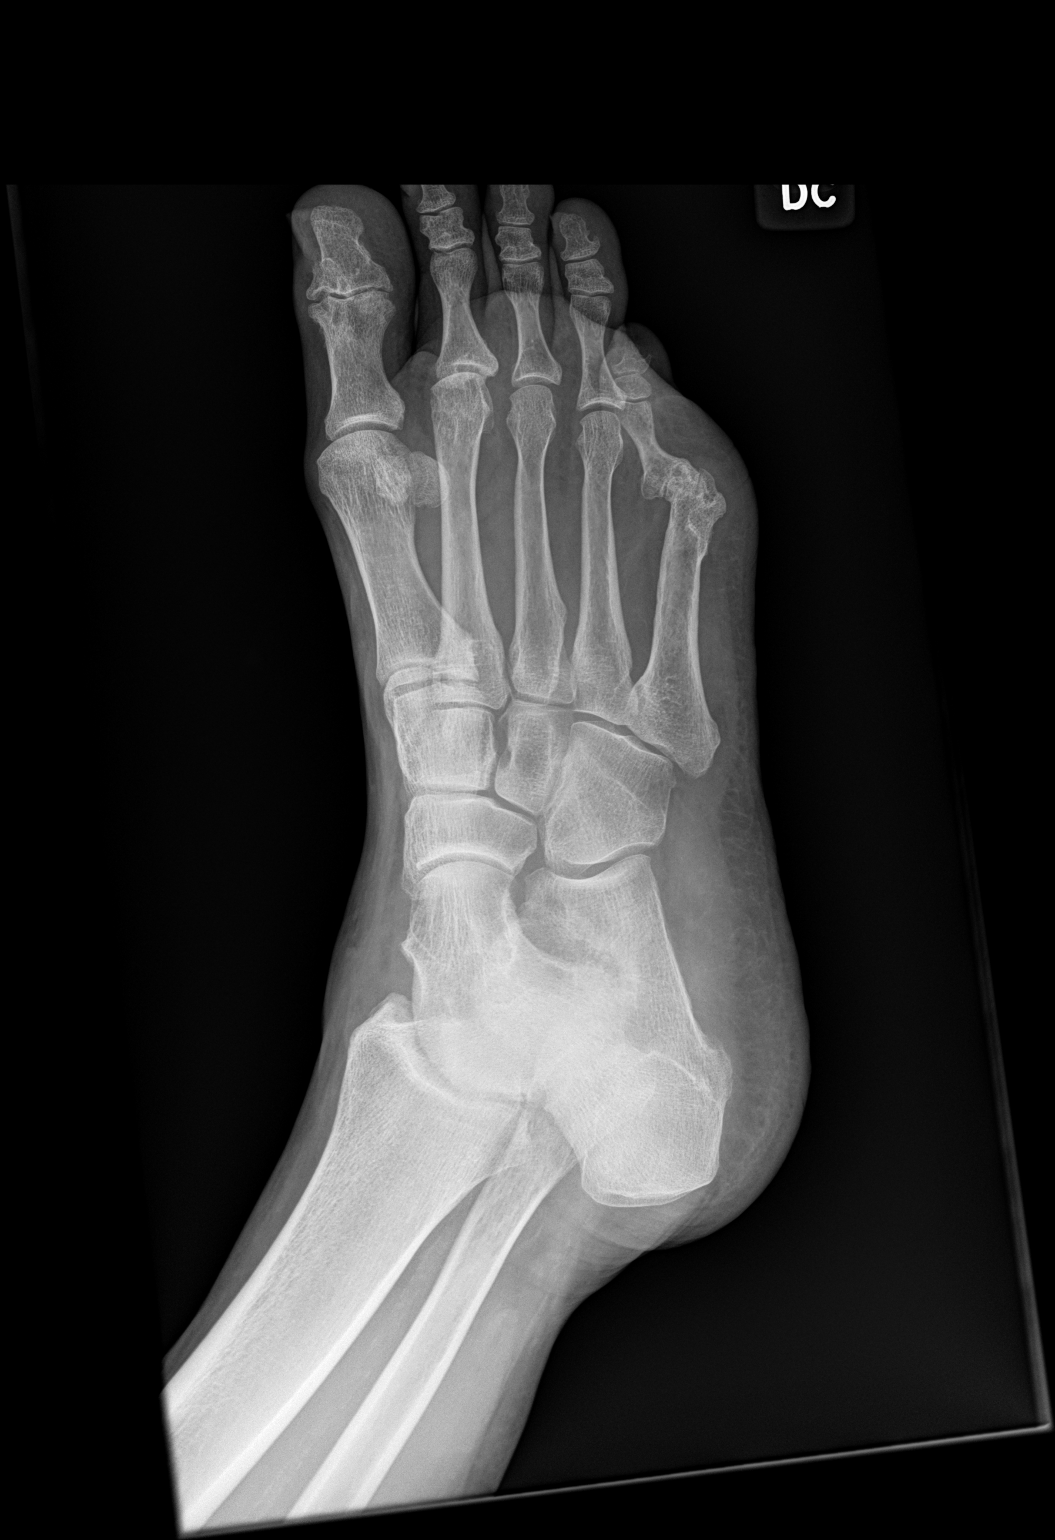

[left lateral]
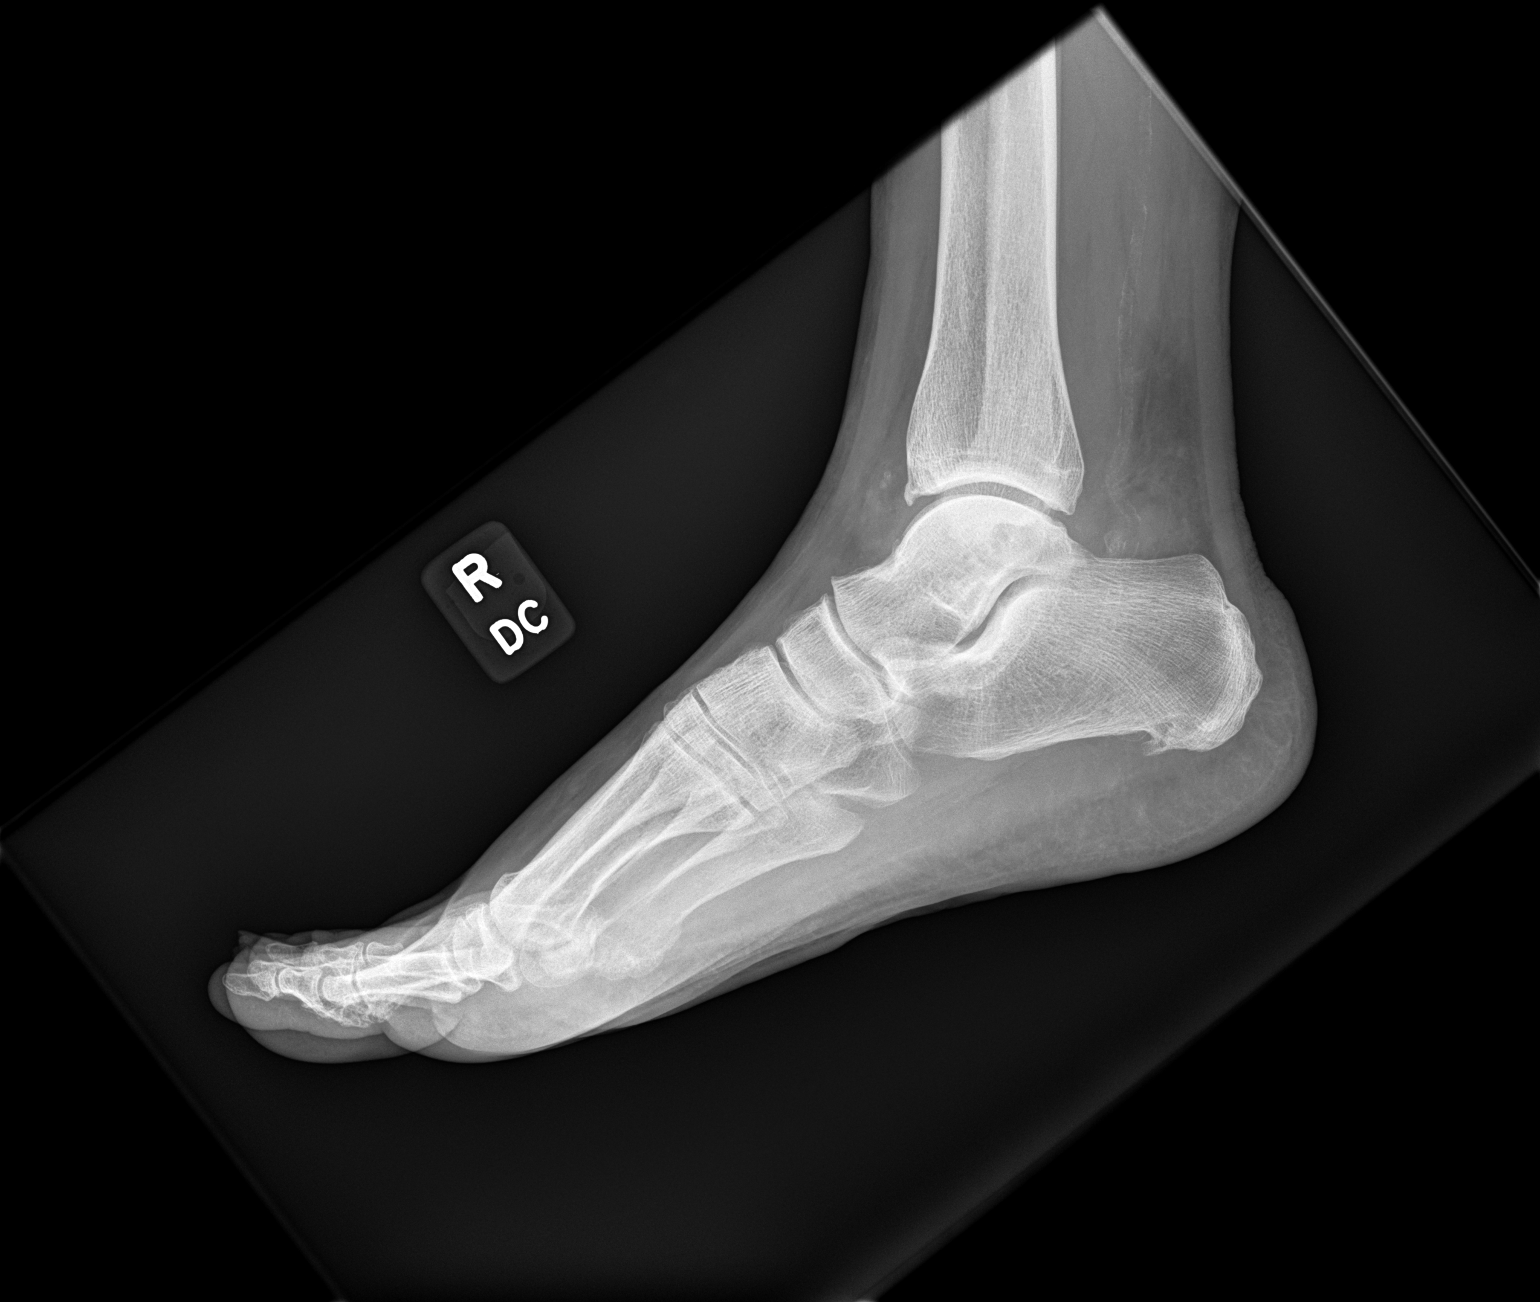

[3 of 3 positions shown; findings below may reference images not displayed]

FINDINGS: Chronic fracture deformity of the distal fifth
metatarsal, with deformity at the fifth MTP joint.

There is some degenerative change in the first toe
interphalangeal joint.

There is some degenerative change in the second MTP joint.

No acute fractures or dislocations identified. No definite
bone destruction.
IMPRESSION: Chronic changes as noted, with no acute osseous
abnormalities identified.

## 2020-12-07 ENCOUNTER — Encounter: Admit: 2020-12-07 | Discharge: 2020-12-07 | Payer: No Typology Code available for payment source

## 2020-12-08 ENCOUNTER — Encounter: Admit: 2020-12-08 | Discharge: 2020-12-08 | Payer: No Typology Code available for payment source

## 2020-12-11 MED FILL — HUMIRA(CF) PEN 40 MG/0.4 ML SC PNKT: 40 mg/0.4 mL | SUBCUTANEOUS | 28 days supply | Qty: 1 | Fill #5 | Status: AC

## 2020-12-15 ENCOUNTER — Encounter: Admit: 2020-12-15 | Discharge: 2020-12-15 | Payer: No Typology Code available for payment source

## 2021-01-02 ENCOUNTER — Encounter: Admit: 2021-01-02 | Discharge: 2021-01-02 | Payer: No Typology Code available for payment source

## 2021-01-03 ENCOUNTER — Encounter: Admit: 2021-01-03 | Discharge: 2021-01-03 | Payer: No Typology Code available for payment source

## 2021-01-03 MED FILL — HUMIRA(CF) PEN 40 MG/0.4 ML SC PNKT: 40 mg/0.4 mL | SUBCUTANEOUS | 28 days supply | Qty: 1 | Fill #6 | Status: AC

## 2021-01-23 ENCOUNTER — Encounter: Admit: 2021-01-23 | Discharge: 2021-01-23 | Payer: No Typology Code available for payment source

## 2021-01-23 MED ORDER — PANTOPRAZOLE 40 MG PO TBEC
40 mg | ORAL_TABLET | Freq: Every day | ORAL | 1 refills | 90.00000 days | Status: AC
Start: 2021-01-23 — End: ?

## 2021-01-23 NOTE — Telephone Encounter
Refill request received for    Name from pharmacy: Pantoprazole Sodium Oral Tablet Delayed Release 40 MG 40 MG Tablet Delayed Release         Will file in chart as: pantoprazole DR (PROTONIX) 40 mg tablet    Sig: Take one tablet by mouth daily.    Original sig: TAKE ONE TABLET BY MOUTH DAILY    Disp:  90 tablet  Refills:  1 (Pharmacy requested: Not specified)    Start: 01/23/2021    Class: Normal    Non-formulary    Last ordered: 4 months ago by Onnie Boer, MD Last refill: 09/21/2020       Last OV 09/21/20      Routing to Dr. Ihor Gully for approval/refusal

## 2021-01-24 ENCOUNTER — Encounter: Admit: 2021-01-24 | Discharge: 2021-01-24 | Payer: No Typology Code available for payment source

## 2021-01-24 MED ORDER — HUMIRA(CF) PEN 40 MG/0.4 ML SC PNKT
40 mg | PACK | SUBCUTANEOUS | 2 refills
Start: 2021-01-24 — End: ?

## 2021-01-24 NOTE — Telephone Encounter
Pharmacy is requesting a refill of Humira. Pt last seen on 03/08/20. Pt is not scheduled for follow up.    Plan:  1. Discussed Plan  2. Obtain updated labs (see below)  3. Continue Humira 40 mg subcutaneous every 2 weeks  4. Continue Naproxen 500 mg by mouth BID  5. Continue Omeprazole 20 mg by mouth daily.  6. NSAID monitoring: will need to monitor kidney function every 6 months while on daily NSAID use  7. Keep appointment scheduled with Neurology for evaluation of syncope, going to see Dr. Assunta Curtis on 05/23/2020. Just saw Neuropsychology as well and they will be following up with him about results from his testing he had done that day  8. Continue to follow up with rehab medicine for chronic cervical spine and lumbar spine back pain and stiffness as well as chronic pain syndrome. Will also discuss options of adding on Cymbalta, Lyrica or retrying gabapentin for management of fibromyalgia. Will also discuss with PCP as well as he is prescribing the patients minipress and Celexa.  9. Will request recent labs from PCP and see what was done and order if not recent enough or not labs we need    Routing to provider for review

## 2021-01-25 ENCOUNTER — Encounter: Admit: 2021-01-25 | Discharge: 2021-01-25 | Payer: No Typology Code available for payment source

## 2021-02-07 ENCOUNTER — Encounter: Admit: 2021-02-07 | Discharge: 2021-02-07 | Payer: No Typology Code available for payment source

## 2021-02-07 MED FILL — HUMIRA(CF) PEN 40 MG/0.4 ML SC PNKT: 40 mg/0.4 mL | SUBCUTANEOUS | 28 days supply | Qty: 1 | Fill #1 | Status: AC

## 2021-02-08 ENCOUNTER — Encounter: Admit: 2021-02-08 | Discharge: 2021-02-08 | Payer: No Typology Code available for payment source

## 2021-02-28 ENCOUNTER — Ambulatory Visit: Admit: 2021-02-28 | Discharge: 2021-03-01 | Payer: No Typology Code available for payment source

## 2021-02-28 ENCOUNTER — Encounter: Admit: 2021-02-28 | Discharge: 2021-02-28 | Payer: No Typology Code available for payment source

## 2021-02-28 DIAGNOSIS — M797 Fibromyalgia: Secondary | ICD-10-CM

## 2021-02-28 DIAGNOSIS — M0239 Reiter's disease, multiple sites: Secondary | ICD-10-CM

## 2021-02-28 DIAGNOSIS — M1990 Unspecified osteoarthritis, unspecified site: Secondary | ICD-10-CM

## 2021-02-28 DIAGNOSIS — D849 Immunodeficiency, unspecified: Secondary | ICD-10-CM

## 2021-02-28 DIAGNOSIS — R519 Generalized headaches: Secondary | ICD-10-CM

## 2021-02-28 DIAGNOSIS — F331 Major depressive disorder, recurrent, moderate: Secondary | ICD-10-CM

## 2021-02-28 DIAGNOSIS — M5030 Other cervical disc degeneration, unspecified cervical region: Secondary | ICD-10-CM

## 2021-02-28 DIAGNOSIS — S32010S Wedge compression fracture of first lumbar vertebra, sequela: Secondary | ICD-10-CM

## 2021-02-28 DIAGNOSIS — A4902 Methicillin resistant Staphylococcus aureus infection, unspecified site: Secondary | ICD-10-CM

## 2021-02-28 DIAGNOSIS — S22080D Wedge compression fracture of T11-T12 vertebra, subsequent encounter for fracture with routine healing: Secondary | ICD-10-CM

## 2021-02-28 DIAGNOSIS — Z79899 Other long term (current) drug therapy: Secondary | ICD-10-CM

## 2021-02-28 DIAGNOSIS — F32A Depression: Secondary | ICD-10-CM

## 2021-02-28 DIAGNOSIS — S069XAA TBI (traumatic brain injury): Secondary | ICD-10-CM

## 2021-02-28 DIAGNOSIS — M436 Torticollis: Secondary | ICD-10-CM

## 2021-02-28 DIAGNOSIS — R202 Paresthesia of skin: Secondary | ICD-10-CM

## 2021-02-28 DIAGNOSIS — Z1589 Genetic susceptibility to other disease: Secondary | ICD-10-CM

## 2021-02-28 DIAGNOSIS — I2699 Other pulmonary embolism without acute cor pulmonale: Secondary | ICD-10-CM

## 2021-02-28 DIAGNOSIS — R5382 Chronic fatigue, unspecified: Secondary | ICD-10-CM

## 2021-02-28 DIAGNOSIS — F419 Anxiety disorder, unspecified: Secondary | ICD-10-CM

## 2021-02-28 DIAGNOSIS — G479 Sleep disorder, unspecified: Secondary | ICD-10-CM

## 2021-02-28 DIAGNOSIS — R252 Cramp and spasm: Secondary | ICD-10-CM

## 2021-02-28 DIAGNOSIS — F4310 Post-traumatic stress disorder, unspecified: Secondary | ICD-10-CM

## 2021-02-28 MED ORDER — NAPROXEN 500 MG PO TAB
500 mg | ORAL_TABLET | Freq: Two times a day (BID) | ORAL | 1 refills | Status: AC
Start: 2021-02-28 — End: ?

## 2021-02-28 MED ORDER — HUMIRA(CF) PEN 40 MG/0.4 ML SC PNKT
40 mg | PACK | SUBCUTANEOUS | 5 refills | Status: AC
Start: 2021-02-28 — End: ?
  Filled 2021-03-14: qty 1, 28d supply, fill #1
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
## 2021-03-01 ENCOUNTER — Encounter: Admit: 2021-03-01 | Discharge: 2021-03-01 | Payer: No Typology Code available for payment source

## 2021-03-02 ENCOUNTER — Encounter: Admit: 2021-03-02 | Discharge: 2021-03-02 | Payer: No Typology Code available for payment source

## 2021-03-02 NOTE — Progress Notes
The Prior Authorization for Humira has been submitted for Philip Bennett via Cover My Meds.  Will continue to follow.    Thompson Caul  Pharmacy Patient Advocate  [phone]

## 2021-03-05 ENCOUNTER — Encounter: Admit: 2021-03-05 | Discharge: 2021-03-05 | Payer: No Typology Code available for payment source

## 2021-03-05 NOTE — Telephone Encounter
Patient spouse left voicemail stating patient had a seizure, was unresponsive for several hours and was admitted to the hospital from the ER on Saturday, needs follow-up visit     This RN called to follow up: left voicemail
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
## 2021-03-09 ENCOUNTER — Encounter: Admit: 2021-03-09 | Discharge: 2021-03-09 | Payer: No Typology Code available for payment source

## 2021-03-09 ENCOUNTER — Ambulatory Visit: Admit: 2021-03-09 | Discharge: 2021-03-09 | Payer: No Typology Code available for payment source

## 2021-03-09 DIAGNOSIS — S069XAA TBI (traumatic brain injury): Secondary | ICD-10-CM

## 2021-03-09 DIAGNOSIS — A4902 Methicillin resistant Staphylococcus aureus infection, unspecified site: Secondary | ICD-10-CM

## 2021-03-09 DIAGNOSIS — S22080D Wedge compression fracture of T11-T12 vertebra, subsequent encounter for fracture with routine healing: Secondary | ICD-10-CM

## 2021-03-09 DIAGNOSIS — S32010S Wedge compression fracture of first lumbar vertebra, sequela: Secondary | ICD-10-CM

## 2021-03-09 DIAGNOSIS — F419 Anxiety disorder, unspecified: Secondary | ICD-10-CM

## 2021-03-09 DIAGNOSIS — M1990 Unspecified osteoarthritis, unspecified site: Secondary | ICD-10-CM

## 2021-03-09 DIAGNOSIS — R519 Generalized headaches: Secondary | ICD-10-CM

## 2021-03-09 DIAGNOSIS — R569 Unspecified convulsions: Secondary | ICD-10-CM

## 2021-03-09 DIAGNOSIS — R0781 Pleurodynia: Secondary | ICD-10-CM

## 2021-03-09 DIAGNOSIS — F32A Depression: Secondary | ICD-10-CM

## 2021-03-09 DIAGNOSIS — I2699 Other pulmonary embolism without acute cor pulmonale: Secondary | ICD-10-CM

## 2021-03-09 DIAGNOSIS — M797 Fibromyalgia: Secondary | ICD-10-CM

## 2021-03-09 MED ORDER — LIDOCAINE 5 % TP PTMD
1 | MEDICATED_PATCH | TOPICAL | 0 refills | Status: CN
Start: 2021-03-09 — End: ?

## 2021-03-09 MED ORDER — PANTOPRAZOLE 40 MG PO TBEC
40 mg | ORAL_TABLET | Freq: Every day | ORAL | 1 refills | 90.00000 days | Status: AC
Start: 2021-03-09 — End: ?

## 2021-03-09 MED ORDER — PRAZOSIN 2 MG PO CAP
4 mg | ORAL_CAPSULE | Freq: Every evening | ORAL | 3 refills | Status: AC
Start: 2021-03-09 — End: ?

## 2021-03-09 MED ORDER — NAPROXEN 500 MG PO TAB
500 mg | ORAL_TABLET | Freq: Two times a day (BID) | ORAL | 1 refills | Status: AC
Start: 2021-03-09 — End: ?

## 2021-03-09 MED ORDER — CITALOPRAM 40 MG PO TAB
40 mg | ORAL_TABLET | Freq: Every day | ORAL | 0 refills | Status: AC
Start: 2021-03-09 — End: ?

## 2021-03-09 NOTE — Progress Notes
I discussed the care of Mr.Gilday with Dr. Ihor Austin, MD at the time of the visit, and I agree with the evaluation and plan as documented by this resident, unless noted below, or in the resident's note in bold italics.    Vitals:    03/09/21 0940   BP: 115/88   Pulse: 96   Temp: 36.7 C (98.1 F)   SpO2: 98%            Encounter Diagnoses   Name Primary?    Seizure (HCC) Yes    Rib pain on left side         Orders Placed This Encounter    pantoprazole DR (PROTONIX) 40 mg tablet    prazosin (MINIPRESS) 2 mg capsule    naproxen (NAPROSYN) 500 mg tablet    citalopram (CELEXA) 40 mg tablet    lidocaine (LIDODERM) 5 % topical patch     Return in about 3 months (around 06/06/2021).  Future Appointments   Date Time Provider Department Center   03/21/2021  1:00 PM [facility] ROOM 1 [facility] Radiology   05/03/2021  3:00 PM Romana Juniper, MD [facility] Neurology   05/29/2021 10:40 AM Ihor Austin, MD [facility] IM   05/30/2021  1:00 PM Romana Juniper, MD [facility] Neurology     Lucienne Capers, DO      Answers for HPI/ROS submitted by the patient on 03/08/2021  What topic(s) would you like to cover during your appointment?: Had a seizure sat night, was unresponsive for several hours  Please describe the issue(s) and history with the issue (location, severity, duration, symptoms, etc.). : Idk  What has been done so far to take care of the issue(s)?: Stay overnight in hospital  What are your goals for this visit?: Hopefully get an appt for an eeg

## 2021-03-09 NOTE — Assessment & Plan Note
>   Order lidocaine patches to place on the left side.  12 hours on 12 hours off.

## 2021-03-09 NOTE — Assessment & Plan Note
>   Has follow-up set up with neurology on 05/03/2021  > Currently on phenytoin 100 mg 3 times daily

## 2021-03-14 ENCOUNTER — Encounter: Admit: 2021-03-14 | Discharge: 2021-03-14 | Payer: No Typology Code available for payment source

## 2021-03-14 NOTE — Progress Notes
The Prior Authorization for Humira was approved for LAVANTE TOSO  Until 03/02/22.  The copay is $0.00.  The [place] authorization number is [id number].    The medication will be delivered to patient's prescription address per the patient's request.    Thompson Caul  Pharmacy Patient Advocate  [phone]

## 2021-03-21 ENCOUNTER — Ambulatory Visit: Admit: 2021-03-21 | Discharge: 2021-03-21 | Payer: No Typology Code available for payment source

## 2021-03-21 ENCOUNTER — Encounter: Admit: 2021-03-21 | Discharge: 2021-03-21 | Payer: No Typology Code available for payment source

## 2021-03-21 DIAGNOSIS — R1084 Generalized abdominal pain: Secondary | ICD-10-CM

## 2021-04-12 ENCOUNTER — Encounter: Admit: 2021-04-12 | Discharge: 2021-04-12 | Payer: No Typology Code available for payment source

## 2021-04-13 MED FILL — HUMIRA(CF) PEN 40 MG/0.4 ML SC PNKT: 40 mg/0.4 mL | SUBCUTANEOUS | 28 days supply | Qty: 1 | Fill #2 | Status: AC

## 2021-04-29 ENCOUNTER — Encounter: Admit: 2021-04-29 | Discharge: 2021-04-29 | Payer: No Typology Code available for payment source

## 2021-05-03 ENCOUNTER — Encounter: Admit: 2021-05-03 | Discharge: 2021-05-03 | Payer: No Typology Code available for payment source

## 2021-05-04 ENCOUNTER — Encounter: Admit: 2021-05-04 | Discharge: 2021-05-04 | Payer: No Typology Code available for payment source

## 2021-05-05 ENCOUNTER — Encounter: Admit: 2021-05-05 | Discharge: 2021-05-05 | Payer: No Typology Code available for payment source

## 2021-05-06 MED FILL — HUMIRA(CF) PEN 40 MG/0.4 ML SC PNKT: 40 mg/0.4 mL | SUBCUTANEOUS | 28 days supply | Qty: 1 | Fill #3 | Status: AC

## 2021-05-23 ENCOUNTER — Encounter: Admit: 2021-05-23 | Discharge: 2021-05-23 | Payer: No Typology Code available for payment source

## 2021-05-29 ENCOUNTER — Ambulatory Visit: Admit: 2021-05-29 | Discharge: 2021-05-29 | Payer: No Typology Code available for payment source

## 2021-05-29 ENCOUNTER — Ambulatory Visit: Admit: 2021-05-29 | Discharge: 2021-05-30 | Payer: No Typology Code available for payment source

## 2021-05-29 ENCOUNTER — Encounter: Admit: 2021-05-29 | Discharge: 2021-05-29 | Payer: No Typology Code available for payment source

## 2021-05-29 DIAGNOSIS — S069XAA TBI (traumatic brain injury) (HCC): Secondary | ICD-10-CM

## 2021-05-29 DIAGNOSIS — S32010S Wedge compression fracture of first lumbar vertebra, sequela: Secondary | ICD-10-CM

## 2021-05-29 DIAGNOSIS — M0230 Reiter's disease, unspecified site: Secondary | ICD-10-CM

## 2021-05-29 DIAGNOSIS — A4902 Methicillin resistant Staphylococcus aureus infection, unspecified site: Secondary | ICD-10-CM

## 2021-05-29 DIAGNOSIS — K219 Gastro-esophageal reflux disease without esophagitis: Secondary | ICD-10-CM

## 2021-05-29 DIAGNOSIS — R0781 Pleurodynia: Secondary | ICD-10-CM

## 2021-05-29 DIAGNOSIS — F1029 Alcohol dependence with unspecified alcohol-induced disorder: Secondary | ICD-10-CM

## 2021-05-29 DIAGNOSIS — E785 Hyperlipidemia, unspecified: Secondary | ICD-10-CM

## 2021-05-29 DIAGNOSIS — M1990 Unspecified osteoarthritis, unspecified site: Secondary | ICD-10-CM

## 2021-05-29 DIAGNOSIS — I82411 Acute embolism and thrombosis of right femoral vein: Secondary | ICD-10-CM

## 2021-05-29 DIAGNOSIS — I2699 Other pulmonary embolism without acute cor pulmonale: Secondary | ICD-10-CM

## 2021-05-29 DIAGNOSIS — F32A Depression: Secondary | ICD-10-CM

## 2021-05-29 DIAGNOSIS — S22080D Wedge compression fracture of T11-T12 vertebra, subsequent encounter for fracture with routine healing: Secondary | ICD-10-CM

## 2021-05-29 DIAGNOSIS — Z0000 Encounter for general adult medical examination without abnormal findings: Secondary | ICD-10-CM

## 2021-05-29 DIAGNOSIS — R519 Generalized headaches: Secondary | ICD-10-CM

## 2021-05-29 DIAGNOSIS — F331 Major depressive disorder, recurrent, moderate: Secondary | ICD-10-CM

## 2021-05-29 DIAGNOSIS — D849 Immunodeficiency, unspecified: Secondary | ICD-10-CM

## 2021-05-29 DIAGNOSIS — F4310 Post-traumatic stress disorder, unspecified: Secondary | ICD-10-CM

## 2021-05-29 DIAGNOSIS — F419 Anxiety disorder, unspecified: Secondary | ICD-10-CM

## 2021-05-29 DIAGNOSIS — Z720 Tobacco use: Secondary | ICD-10-CM

## 2021-05-29 DIAGNOSIS — R569 Unspecified convulsions: Secondary | ICD-10-CM

## 2021-05-29 DIAGNOSIS — M797 Fibromyalgia: Secondary | ICD-10-CM

## 2021-05-29 LAB — CBC AND DIFF
ABSOLUTE BASO COUNT: 0 K/UL (ref 0–0.20)
ABSOLUTE EOS COUNT: 0.1 K/UL (ref 0–0.45)
ABSOLUTE LYMPH COUNT: 2.1 K/UL (ref 1.0–4.8)
ABSOLUTE MONO COUNT: 0.6 K/UL (ref 0–0.80)
ABSOLUTE NEUTROPHIL: 2.9 K/UL (ref 1.8–7.0)
BASOPHILS %: 1 % (ref 0–2)
EOSINOPHILS %: 2 % (ref >60)
HEMATOCRIT: 39 % — ABNORMAL LOW (ref 40–50)
HEMOGLOBIN: 13 g/dL (ref 13.5–16.5)
LYMPHOCYTES %: 37 % (ref 24–44)
MCH: 35 pg — ABNORMAL HIGH (ref 26–34)
MCHC: 34 g/dL (ref 32.0–36.0)
MCV: 104 FL — ABNORMAL HIGH (ref 80–100)
MONOCYTES %: 11 % (ref 4–12)
MPV: 6.4 FL — ABNORMAL LOW (ref 7–11)
NEUTROPHILS %: 49 % (ref 41–77)
PLATELET COUNT: 344 K/UL — ABNORMAL HIGH (ref 150–400)
RBC COUNT: 3.8 M/UL — ABNORMAL LOW (ref 4.4–5.5)
RDW: 14 % (ref 11–15)
WBC COUNT: 5.8 K/UL (ref 4.5–11.0)

## 2021-05-29 LAB — COMPREHENSIVE METABOLIC PANEL
POTASSIUM: 4.2 MMOL/L (ref 3.5–5.1)
SODIUM: 136 MMOL/L — ABNORMAL LOW (ref 137–147)

## 2021-05-29 LAB — VITAMIN B12: VITAMIN B12: 894 pg/mL (ref >40)

## 2021-05-29 LAB — LIPID PROFILE
CHOLESTEROL: 223 mg/dL — ABNORMAL HIGH (ref <200)
LDL: 125 mg/dL — ABNORMAL HIGH (ref <100)
NON HDL CHOLESTEROL: 182 mg/dL
TRIGLYCERIDES: 309 mg/dL — ABNORMAL HIGH (ref <150)
VLDL: 62 mg/dL

## 2021-05-29 LAB — HEMOGLOBIN A1C: HEMOGLOBIN A1C: 5.3 % (ref 4.0–5.7)

## 2021-05-29 MED ORDER — OMEPRAZOLE 40 MG PO CPDR
40 mg | ORAL_CAPSULE | Freq: Every day | ORAL | 0 refills | Status: AC
Start: 2021-05-29 — End: ?

## 2021-05-29 MED ORDER — ATORVASTATIN 40 MG PO TAB
40 mg | ORAL_TABLET | Freq: Every day | ORAL | 3 refills | Status: AC
Start: 2021-05-29 — End: ?

## 2021-05-29 MED ORDER — SERTRALINE 25 MG PO TAB
25 mg | ORAL_TABLET | Freq: Every day | ORAL | 3 refills | Status: CN
Start: 2021-05-29 — End: ?

## 2021-05-29 MED ORDER — DICLOFENAC SODIUM 1 % TP GEL
4 g | Freq: Four times a day (QID) | TOPICAL | 3 refills | 25.00000 days | Status: AC
Start: 2021-05-29 — End: ?

## 2021-05-29 MED ORDER — NAPROXEN 500 MG PO TAB
500 mg | ORAL_TABLET | Freq: Two times a day (BID) | ORAL | 1 refills | Status: AC
Start: 2021-05-29 — End: ?

## 2021-05-29 NOTE — Assessment & Plan Note
-  Follows with [facility]  -On humira and naproxyn  -Symptoms and pain increased  Plan  > Recommended patient to follow up with Rheumatology.

## 2021-05-29 NOTE — Assessment & Plan Note
-   Patient states that he is drinking daily alcohol since he was 17.  -Currently drinking about 88 ounces of wine coolers daily.  He feels that he needs to have something in his  system to prevent him from thinking about all the negative things in his life.  -He is open to stopping if he can do this in a safe manner.  We discussed the benefits of stopping alcohol and preventing future locations including liver disease, liver cancer, heart disease and more.  Plan  > Will consult social work for cessation counseling and resources.  > Patient will need to undergo withdrawal in an inpatient setting due to high risk of serious withdrawal symptoms including DTs.  It is possible that his seizure was provoked from alcohol withdrawal in January 2023.  > We will need long-term follow-up and resources to maintain sobriety.  > We will plan to start medications for mood and depression following withdrawal of alcohol in the hopes to maintain his sobriety.

## 2021-05-29 NOTE — Assessment & Plan Note
Plan  > Xray to eval for fracture due to continued worsening pain.  > Voltaren gel to left rib for pain
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
## 2021-05-29 NOTE — Assessment & Plan Note
-   On Humira

## 2021-05-29 NOTE — Assessment & Plan Note
-   Continue taking phenytoin 100 mg 3 times daily  -Follow-up with neurology scheduled 05/30/2021

## 2021-05-29 NOTE — Assessment & Plan Note
-   CBC, CMP, lipid panel, A1c, B12, vitamin D levels ordered today

## 2021-05-29 NOTE — Assessment & Plan Note
Repeat lipid panel today

## 2021-05-30 ENCOUNTER — Encounter: Admit: 2021-05-30 | Discharge: 2021-05-30 | Payer: No Typology Code available for payment source

## 2021-05-30 DIAGNOSIS — R0781 Pleurodynia: Secondary | ICD-10-CM

## 2021-05-30 DIAGNOSIS — Z0000 Encounter for general adult medical examination without abnormal findings: Secondary | ICD-10-CM

## 2021-06-05 ENCOUNTER — Encounter: Admit: 2021-06-05 | Discharge: 2021-06-05 | Payer: No Typology Code available for payment source

## 2021-06-05 ENCOUNTER — Ambulatory Visit: Admit: 2021-06-05 | Discharge: 2021-06-06 | Payer: No Typology Code available for payment source

## 2021-06-05 DIAGNOSIS — S32010S Wedge compression fracture of first lumbar vertebra, sequela: Secondary | ICD-10-CM

## 2021-06-05 DIAGNOSIS — R748 Abnormal levels of other serum enzymes: Secondary | ICD-10-CM

## 2021-06-05 DIAGNOSIS — S22080D Wedge compression fracture of T11-T12 vertebra, subsequent encounter for fracture with routine healing: Secondary | ICD-10-CM

## 2021-06-05 DIAGNOSIS — F1029 Alcohol dependence with unspecified alcohol-induced disorder: Secondary | ICD-10-CM

## 2021-06-05 DIAGNOSIS — E559 Vitamin D deficiency, unspecified: Secondary | ICD-10-CM

## 2021-06-05 DIAGNOSIS — Z79899 Other long term (current) drug therapy: Secondary | ICD-10-CM

## 2021-06-05 DIAGNOSIS — I82411 Acute embolism and thrombosis of right femoral vein: Secondary | ICD-10-CM

## 2021-06-05 DIAGNOSIS — R600 Localized edema: Secondary | ICD-10-CM

## 2021-06-05 DIAGNOSIS — F32A Depression: Secondary | ICD-10-CM

## 2021-06-05 DIAGNOSIS — Z1589 Genetic susceptibility to other disease: Secondary | ICD-10-CM

## 2021-06-05 DIAGNOSIS — F419 Anxiety disorder, unspecified: Secondary | ICD-10-CM

## 2021-06-05 DIAGNOSIS — A4902 Methicillin resistant Staphylococcus aureus infection, unspecified site: Secondary | ICD-10-CM

## 2021-06-05 DIAGNOSIS — R718 Other abnormality of red blood cells: Secondary | ICD-10-CM

## 2021-06-05 DIAGNOSIS — M797 Fibromyalgia: Secondary | ICD-10-CM

## 2021-06-05 DIAGNOSIS — R519 Generalized headaches: Secondary | ICD-10-CM

## 2021-06-05 DIAGNOSIS — S069XAA TBI (traumatic brain injury) (HCC): Secondary | ICD-10-CM

## 2021-06-05 DIAGNOSIS — M1990 Unspecified osteoarthritis, unspecified site: Secondary | ICD-10-CM

## 2021-06-05 DIAGNOSIS — M0239 Reiter's disease, multiple sites: Secondary | ICD-10-CM

## 2021-06-05 DIAGNOSIS — D849 Immunodeficiency, unspecified: Secondary | ICD-10-CM

## 2021-06-05 DIAGNOSIS — I2699 Other pulmonary embolism without acute cor pulmonale: Secondary | ICD-10-CM

## 2021-06-05 LAB — FOLATE, SERUM: SERUM FOLATE: 11 ng/mL (ref >3.9)

## 2021-06-05 LAB — SED RATE: ESR: 5 mm/h (ref 0–20)

## 2021-06-05 LAB — BILIRUBIN,TOTAL: TOTAL BILIRUBIN: 0.7 mg/dL (ref 0.3–1.2)

## 2021-06-05 LAB — GGTP: GGTP: 130 U/L — ABNORMAL HIGH (ref 9–64)

## 2021-06-05 LAB — C REACTIVE PROTEIN (CRP): C-REACTIVE PROTEIN: 0.5 mg/dL (ref <1.0)

## 2021-06-05 MED ORDER — ERGOCALCIFEROL (VITAMIN D2) 1,250 MCG (50,000 UNIT) PO CAP
1 | ORAL_CAPSULE | ORAL | 0 refills | 56.00000 days | Status: AC
Start: 2021-06-05 — End: ?

## 2021-06-06 DIAGNOSIS — M0239 Reiter's disease, multiple sites: Secondary | ICD-10-CM

## 2021-06-06 DIAGNOSIS — Z86718 Personal history of other venous thrombosis and embolism: Secondary | ICD-10-CM

## 2021-06-06 DIAGNOSIS — R718 Other abnormality of red blood cells: Secondary | ICD-10-CM

## 2021-06-11 ENCOUNTER — Encounter: Admit: 2021-06-11 | Discharge: 2021-06-11 | Payer: No Typology Code available for payment source

## 2021-07-04 ENCOUNTER — Encounter: Admit: 2021-07-04 | Discharge: 2021-07-04 | Payer: No Typology Code available for payment source

## 2021-07-09 ENCOUNTER — Encounter: Admit: 2021-07-09 | Discharge: 2021-07-09 | Payer: No Typology Code available for payment source

## 2021-07-10 ENCOUNTER — Encounter: Admit: 2021-07-10 | Discharge: 2021-07-10 | Payer: No Typology Code available for payment source

## 2021-07-26 ENCOUNTER — Encounter: Admit: 2021-07-26 | Discharge: 2021-07-26 | Payer: No Typology Code available for payment source

## 2021-08-16 ENCOUNTER — Encounter: Admit: 2021-08-16 | Discharge: 2021-08-16 | Payer: No Typology Code available for payment source

## 2021-11-02 ENCOUNTER — Encounter: Admit: 2021-11-02 | Discharge: 2021-11-02 | Payer: No Typology Code available for payment source

## 2021-11-06 ENCOUNTER — Encounter: Admit: 2021-11-06 | Discharge: 2021-11-06 | Payer: No Typology Code available for payment source

## 2021-11-07 ENCOUNTER — Encounter: Admit: 2021-11-07 | Discharge: 2021-11-07 | Payer: No Typology Code available for payment source

## 2021-11-07 NOTE — Progress Notes
Patient did not attend clinic appointment visit on 10/4, called to do reassessment.     This is the final attempt to reach Philip Bennett to verify compliance and assess tolerance of adalimumab (Humira). Left voicemail asking the patient to return call to pharmacist at [phone].    Thus, the patient will be removed from the specialty pharmacy patient management program. The patient may be re-enrolled at any time by contacting the pharmacist.

## 2021-11-13 ENCOUNTER — Encounter: Admit: 2021-11-13 | Discharge: 2021-11-13 | Payer: No Typology Code available for payment source

## 2022-04-05 ENCOUNTER — Encounter: Admit: 2022-04-05 | Discharge: 2022-04-05 | Payer: No Typology Code available for payment source

## 2022-05-09 ENCOUNTER — Encounter: Admit: 2022-05-09 | Discharge: 2022-05-09 | Payer: No Typology Code available for payment source

## 2022-05-09 NOTE — Progress Notes
Patient is no longer taking the specialty medication adalimumab (Humira) due to prescription being expired. Patient has not been seen in clinic since 06/05/21.  The physician/APP is aware. Thus, the patient will be removed from the specialty pharmacy patient management program.     The medication has been removed from the patient's active medication list.  Should the patient's care team wish to restart the medication, a new prescription will need to be sent to [facility].

## 2023-03-13 ENCOUNTER — Encounter: Admit: 2023-03-13 | Discharge: 2023-03-13 | Payer: PRIVATE HEALTH INSURANCE
